# Patient Record
Sex: Female | Born: 1985 | Race: Black or African American | Hispanic: No | Marital: Single | Smoking: Former smoker
Health system: Southern US, Community
[De-identification: ages and names within clinical notes are randomized; demographics above are authoritative.]

## PROBLEM LIST (undated history)

## (undated) ENCOUNTER — Inpatient Hospital Stay (HOSPITAL_COMMUNITY): Payer: Self-pay

## (undated) DIAGNOSIS — D573 Sickle-cell trait: Secondary | ICD-10-CM

## (undated) DIAGNOSIS — A5901 Trichomonal vulvovaginitis: Secondary | ICD-10-CM

## (undated) DIAGNOSIS — Z98891 History of uterine scar from previous surgery: Secondary | ICD-10-CM

## (undated) DIAGNOSIS — Z8619 Personal history of other infectious and parasitic diseases: Secondary | ICD-10-CM

## (undated) DIAGNOSIS — Z973 Presence of spectacles and contact lenses: Secondary | ICD-10-CM

## (undated) DIAGNOSIS — Z789 Other specified health status: Secondary | ICD-10-CM

## (undated) HISTORY — DX: Sickle-cell trait: D57.3

## (undated) HISTORY — DX: Trichomonal vulvovaginitis: A59.01

## (undated) HISTORY — PX: NO PAST SURGERIES: SHX2092

---

## 2012-04-06 LAB — OB RESULTS CONSOLE GC/CHLAMYDIA
Chlamydia: NEGATIVE
Gonorrhea: NEGATIVE

## 2012-04-06 LAB — OB RESULTS CONSOLE HEPATITIS B SURFACE ANTIGEN: HEP B S AG: NEGATIVE

## 2012-06-10 LAB — OB RESULTS CONSOLE RPR: RPR: NONREACTIVE

## 2012-06-10 LAB — OB RESULTS CONSOLE HIV ANTIBODY (ROUTINE TESTING): HIV: NONREACTIVE

## 2013-04-06 ENCOUNTER — Other Ambulatory Visit: Payer: Self-pay

## 2013-04-06 LAB — OB RESULTS CONSOLE ANTIBODY SCREEN: Antibody Screen: NEGATIVE

## 2013-04-06 LAB — OB RESULTS CONSOLE ABO/RH: RH TYPE: POSITIVE

## 2013-04-11 ENCOUNTER — Other Ambulatory Visit (HOSPITAL_COMMUNITY): Payer: Self-pay | Admitting: Obstetrics and Gynecology

## 2013-04-11 DIAGNOSIS — Z0489 Encounter for examination and observation for other specified reasons: Secondary | ICD-10-CM

## 2013-04-11 DIAGNOSIS — IMO0002 Reserved for concepts with insufficient information to code with codable children: Secondary | ICD-10-CM

## 2013-04-11 DIAGNOSIS — R772 Abnormality of alphafetoprotein: Secondary | ICD-10-CM

## 2013-04-11 LAB — OB RESULTS CONSOLE GC/CHLAMYDIA
Chlamydia: NEGATIVE
Gonorrhea: NEGATIVE

## 2013-04-12 ENCOUNTER — Ambulatory Visit (HOSPITAL_COMMUNITY)
Admission: RE | Admit: 2013-04-12 | Discharge: 2013-04-12 | Disposition: A | Discharge status: Home / Self Care | Payer: Medicaid Other | Source: Ambulatory Visit | Attending: Obstetrics and Gynecology | Admitting: Obstetrics and Gynecology

## 2013-04-12 ENCOUNTER — Encounter (HOSPITAL_COMMUNITY): Payer: Self-pay

## 2013-04-12 ENCOUNTER — Other Ambulatory Visit (HOSPITAL_COMMUNITY): Payer: Self-pay | Admitting: Obstetrics and Gynecology

## 2013-04-12 DIAGNOSIS — R772 Abnormality of alphafetoprotein: Secondary | ICD-10-CM

## 2013-04-12 DIAGNOSIS — IMO0002 Reserved for concepts with insufficient information to code with codable children: Secondary | ICD-10-CM

## 2013-04-12 DIAGNOSIS — Z0489 Encounter for examination and observation for other specified reasons: Secondary | ICD-10-CM

## 2013-04-12 DIAGNOSIS — O289 Unspecified abnormal findings on antenatal screening of mother: Secondary | ICD-10-CM

## 2013-04-12 DIAGNOSIS — O358XX0 Maternal care for other (suspected) fetal abnormality and damage, not applicable or unspecified: Secondary | ICD-10-CM | POA: Insufficient documentation

## 2013-04-12 DIAGNOSIS — O352XX0 Maternal care for (suspected) hereditary disease in fetus, not applicable or unspecified: Secondary | ICD-10-CM | POA: Insufficient documentation

## 2013-04-12 DIAGNOSIS — Z363 Encounter for antenatal screening for malformations: Secondary | ICD-10-CM | POA: Insufficient documentation

## 2013-04-12 DIAGNOSIS — Z1389 Encounter for screening for other disorder: Secondary | ICD-10-CM | POA: Insufficient documentation

## 2013-04-12 LAB — ROUTINE CHROMOSOME - KARYOTYPE + FISH

## 2013-04-12 LAB — AP-AFP (ALPHA FETOPROTEIN)

## 2013-04-12 NOTE — ED Notes (Addendum)
Genetic Counseling  DOB: 1985-08-28 Referring Provider: Melina Schools, MD Appointment Date: 04/12/2013 Attending: Dr. Renella Cunas  [name] and her partner, [name], were seen for genetic counseling because of an increased risk for fetal Down syndrome.  They were counseled regarding the [facility] screen result and the associated 1 in 220 risk for fetal Down syndrome.  We reviewed chromosomes, nondisjunction, and the common features and variable prognosis of Down syndrome.  In addition, we reviewed the screen adjusted reduction in risks for trisomy 18 and ONTDs.  We also discussed other explanations for a screen positive result including: a gestational dating error, differences in maternal metabolism, and normal variation. They understand that this screening is not diagnostic for Down syndrome but provides a risk assessment.  We reviewed available screening options including noninvasive prenatal screening (NIPS)/cell free fetal DNA (cffDNA) testing, and detailed ultrasound.  They were counseled that screening tests are used to modify a patient's a priori risk for aneuploidy, typically based on age. This estimate provides a pregnancy specific risk assessment. We reviewed the benefits and limitations of each option. Specifically, we discussed the conditions for which each test screens, the detection rates, and false positive rates of each. They were also counseled regarding diagnostic testing via amniocentesis. We reviewed the approximate 1 in 850-277 risk for complications for amniocentesis, including spontaneous pregnancy loss.  A complete ultrasound was performed today. The ultrasound report will be sent under separate cover. An echogenic intracardiac focus was identified.  We discussed that this increases the risk for Down syndrome to approximately 1 in 131.  They elected to proceed with amniocentesis and FISH analysis.  They expressed that if they were to learn that the baby has Down  syndrome, they may wish to not continue the pregnancy.  They understand the legal limit of 20 weeks and the 24 hour waiting period prior to a termination procedure.  They were counseled that we will do all that we can to get the preliminary FISH results prior to that time, but cannot guarantee they will be completed.   Both family histories were reviewed and found to be contributory.  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  We discussed that this is an autosomal recessive condition.  In order for her child to have SCA, both she and the father of the baby would have to be carriers, and they would have to pass on the abnormal gene.  [name] says he is not a carrier of a hemoglobin variant.  If he does have normal hemoglobin, then we would not anticipate an increased chance for a clinically significant hemoglobinopathy in this baby.  [name] reported that he has a son from another relationship who was born with a heart defect and had surgery in the newborn period.  We discussed that isolated congenital heart defects typically follow a multifactorial pattern of inheritance.  As such, there would be an approximate 2-3% chance for another child to have an isolated heart defect. Fetal echocardiogram would be an option for further evaluation of the fetal heart.  Without further information regarding the provided family history, an accurate genetic risk cannot be calculated. Further genetic counseling is warranted if more information is obtained.  [name] denied exposure to environmental toxins or chemical agents. She denied the use of alcohol, tobacco or street drugs. She denied significant viral illnesses during the course of her pregnancy. Her medical and surgical histories were noncontributory.   I  counseled this couple for approximately 40 minutes regarding the above risks and available options.   Cam Hai, MS,  Certified Genetic Counselor

## 2013-04-13 ENCOUNTER — Other Ambulatory Visit: Payer: Self-pay

## 2013-04-13 ENCOUNTER — Other Ambulatory Visit: Payer: Self-pay | Admitting: Obstetrics and Gynecology

## 2013-04-13 ENCOUNTER — Telehealth (HOSPITAL_COMMUNITY): Payer: Self-pay | Admitting: *Deleted

## 2013-04-13 ENCOUNTER — Telehealth (HOSPITAL_COMMUNITY): Payer: Self-pay | Admitting: Maternal and Fetal Medicine

## 2013-04-13 NOTE — Telephone Encounter (Signed)
Pt called with c/o cramping this morning.  States she did not have any cramping yesterday after her procedure.  Patient is currently at work for a 14 hour shift.  She has not taken anything for cramping.  Instructed patient that she could take tylenol for cramping and to try to stay off her feet to see if the cramping resolves.  Patient states she called her primary OB office and they instructed her to call here.  Explained to patient that if cramping did not resolve and if she still had concerns that she should go to [facility] to be seen.  She should also call her primary doctor to let them know as they will be treating her.  Pt verbalized understanding.  Encouraged patient to call back with any other questions or concerns.

## 2013-04-13 NOTE — Telephone Encounter (Signed)
Called [employer] to discuss the preliminary FISH results from her amniocentesis We reviewed that these are within normal limits.  We again discussed the limitations of FISH and that final results are still pending and will be available in 1-2 weeks.  Patient reported that she has not noticed any concerning symptoms or complications following the procedure.  All questions were answered to her satisfaction, she was encouraged to call with additional questions or concerns.  Cam Hai, MS Certified Genetic Counselor

## 2013-04-29 ENCOUNTER — Telehealth (HOSPITAL_COMMUNITY): Payer: Self-pay | Admitting: Maternal and Fetal Medicine

## 2013-04-29 NOTE — Telephone Encounter (Signed)
Called Ms. Dise to review the final results of her amniocentesis.  There was no answer and I could not leave a message.

## 2013-05-10 ENCOUNTER — Ambulatory Visit (HOSPITAL_COMMUNITY): Payer: Medicaid Other

## 2013-05-12 ENCOUNTER — Inpatient Hospital Stay (HOSPITAL_COMMUNITY)
Admission: AD | Admit: 2013-05-12 | Discharge: 2013-05-12 | Disposition: A | Discharge status: Home / Self Care | Payer: Medicaid Other | Source: Ambulatory Visit | Attending: Obstetrics and Gynecology | Admitting: Obstetrics and Gynecology

## 2013-05-12 DIAGNOSIS — O4700 False labor before 37 completed weeks of gestation, unspecified trimester: Secondary | ICD-10-CM | POA: Insufficient documentation

## 2013-05-12 MED ORDER — BETAMETHASONE SOD PHOS & ACET 6 (3-3) MG/ML IJ SUSP
12.0000 mg | Freq: Once | INTRAMUSCULAR | Status: AC
Start: 2013-05-12 — End: 2013-05-12
  Administered 2013-05-12: 12 mg via INTRAMUSCULAR
  Filled 2013-05-12: qty 2

## 2013-05-13 ENCOUNTER — Inpatient Hospital Stay (HOSPITAL_COMMUNITY)
Admission: AD | Admit: 2013-05-13 | Discharge: 2013-05-13 | Disposition: A | Discharge status: Home / Self Care | Payer: Medicaid Other | Source: Ambulatory Visit | Attending: Obstetrics and Gynecology | Admitting: Obstetrics and Gynecology

## 2013-05-13 DIAGNOSIS — O4700 False labor before 37 completed weeks of gestation, unspecified trimester: Secondary | ICD-10-CM | POA: Insufficient documentation

## 2013-05-13 MED ORDER — BETAMETHASONE SOD PHOS & ACET 6 (3-3) MG/ML IJ SUSP
12.0000 mg | Freq: Once | INTRAMUSCULAR | Status: AC
  Administered 2013-05-13: 12 mg via INTRAMUSCULAR
  Filled 2013-05-13: qty 2

## 2013-06-10 LAB — OB RESULTS CONSOLE RPR: RPR: NONREACTIVE

## 2013-06-10 LAB — OB RESULTS CONSOLE HIV ANTIBODY (ROUTINE TESTING): HIV: NONREACTIVE

## 2013-08-01 LAB — OB RESULTS CONSOLE GBS
GBS: NEGATIVE
GBS: NEGATIVE

## 2013-08-22 ENCOUNTER — Ambulatory Visit (HOSPITAL_COMMUNITY)
Admission: RE | Admit: 2013-08-22 | Discharge: 2013-08-22 | Disposition: A | Discharge status: Home / Self Care | Payer: Medicaid Other | Source: Ambulatory Visit | Attending: Obstetrics and Gynecology | Admitting: Obstetrics and Gynecology

## 2013-08-22 ENCOUNTER — Other Ambulatory Visit (HOSPITAL_COMMUNITY): Payer: Self-pay | Admitting: Obstetrics and Gynecology

## 2013-08-22 DIAGNOSIS — O288 Other abnormal findings on antenatal screening of mother: Secondary | ICD-10-CM

## 2013-08-22 DIAGNOSIS — O289 Unspecified abnormal findings on antenatal screening of mother: Secondary | ICD-10-CM | POA: Diagnosis not present

## 2013-09-06 ENCOUNTER — Encounter (HOSPITAL_COMMUNITY): Payer: Self-pay | Admitting: *Deleted

## 2013-09-06 ENCOUNTER — Telehealth (HOSPITAL_COMMUNITY): Payer: Self-pay | Admitting: *Deleted

## 2013-09-06 NOTE — Telephone Encounter (Signed)
Preadmission screen  

## 2013-09-08 ENCOUNTER — Inpatient Hospital Stay (HOSPITAL_COMMUNITY)
Admission: AD | Admit: 2013-09-08 | Discharge: 2013-09-11 | DRG: 765 | Disposition: A | Discharge status: Home / Self Care | Payer: Medicaid Other | Source: Ambulatory Visit | Attending: Obstetrics and Gynecology | Admitting: Obstetrics and Gynecology

## 2013-09-08 ENCOUNTER — Encounter (HOSPITAL_COMMUNITY)
Admission: AD | Disposition: A | Discharge status: Home / Self Care | Payer: Self-pay | Source: Ambulatory Visit | Attending: Obstetrics and Gynecology

## 2013-09-08 ENCOUNTER — Encounter (HOSPITAL_COMMUNITY): Payer: Self-pay | Admitting: *Deleted

## 2013-09-08 ENCOUNTER — Encounter (HOSPITAL_COMMUNITY): Payer: Medicaid Other | Admitting: Anesthesiology

## 2013-09-08 ENCOUNTER — Inpatient Hospital Stay (HOSPITAL_COMMUNITY): Payer: Medicaid Other | Admitting: Anesthesiology

## 2013-09-08 DIAGNOSIS — O26879 Cervical shortening, unspecified trimester: Secondary | ICD-10-CM | POA: Diagnosis present

## 2013-09-08 DIAGNOSIS — Z800 Family history of malignant neoplasm of digestive organs: Secondary | ICD-10-CM

## 2013-09-08 DIAGNOSIS — O9902 Anemia complicating childbirth: Secondary | ICD-10-CM | POA: Diagnosis present

## 2013-09-08 DIAGNOSIS — O479 False labor, unspecified: Secondary | ICD-10-CM | POA: Diagnosis present

## 2013-09-08 DIAGNOSIS — O358XX0 Maternal care for other (suspected) fetal abnormality and damage, not applicable or unspecified: Secondary | ICD-10-CM | POA: Diagnosis present

## 2013-09-08 DIAGNOSIS — Z87891 Personal history of nicotine dependence: Secondary | ICD-10-CM | POA: Diagnosis not present

## 2013-09-08 DIAGNOSIS — D573 Sickle-cell trait: Secondary | ICD-10-CM | POA: Diagnosis present

## 2013-09-08 DIAGNOSIS — Z98891 History of uterine scar from previous surgery: Secondary | ICD-10-CM

## 2013-09-08 DIAGNOSIS — O480 Post-term pregnancy: Secondary | ICD-10-CM | POA: Diagnosis present

## 2013-09-08 DIAGNOSIS — O4590 Premature separation of placenta, unspecified, unspecified trimester: Secondary | ICD-10-CM | POA: Diagnosis present

## 2013-09-08 DIAGNOSIS — Z3400 Encounter for supervision of normal first pregnancy, unspecified trimester: Secondary | ICD-10-CM

## 2013-09-08 HISTORY — DX: History of uterine scar from previous surgery: Z98.891

## 2013-09-08 HISTORY — DX: Other specified health status: Z78.9

## 2013-09-08 LAB — CBC
HCT: 34.1 % — ABNORMAL LOW (ref 36.0–46.0)
Hemoglobin: 11.6 g/dL — ABNORMAL LOW (ref 12.0–15.0)
MCH: 28.8 pg (ref 26.0–34.0)
MCHC: 34 g/dL (ref 30.0–36.0)
MCV: 84.6 fL (ref 78.0–100.0)
Platelets: 242 10*3/uL (ref 150–400)
RBC: 4.03 MIL/uL (ref 3.87–5.11)
RDW: 15.2 % (ref 11.5–15.5)
WBC: 8.4 10*3/uL (ref 4.0–10.5)

## 2013-09-08 LAB — TYPE AND SCREEN
ABO/RH(D): O POS
Antibody Screen: NEGATIVE

## 2013-09-08 LAB — ABO/RH: ABO/RH(D): O POS

## 2013-09-08 SURGERY — Surgical Case
Anesthesia: Epidural

## 2013-09-08 MED ORDER — PROMETHAZINE HCL 25 MG/ML IJ SOLN: 6.2500 mg | INTRAMUSCULAR | Status: DC | PRN

## 2013-09-08 MED ORDER — LIDOCAINE HCL (PF) 1 % IJ SOLN
INTRAMUSCULAR | Status: DC | PRN
  Administered 2013-09-08 (×2): 5 mL

## 2013-09-08 MED ORDER — FENTANYL 2.5 MCG/ML BUPIVACAINE 1/10 % EPIDURAL INFUSION (WH - ANES)
14.0000 mL/h | INTRAMUSCULAR | Status: DC | PRN
  Administered 2013-09-08: 14 mL/h via EPIDURAL

## 2013-09-08 MED ORDER — OXYCODONE-ACETAMINOPHEN 5-325 MG PO TABS
1.0000 | ORAL_TABLET | ORAL | Status: DC | PRN
  Administered 2013-09-10: 2 via ORAL
  Administered 2013-09-10 (×2): 1 via ORAL
  Administered 2013-09-10 – 2013-09-11 (×3): 2 via ORAL
  Filled 2013-09-08: qty 2
  Filled 2013-09-08: qty 1
  Filled 2013-09-08 (×3): qty 2
  Filled 2013-09-08: qty 1

## 2013-09-08 MED ORDER — SODIUM CHLORIDE 0.9 % IJ SOLN: 3.0000 mL | INTRAMUSCULAR | Status: DC | PRN

## 2013-09-08 MED ORDER — MIDAZOLAM HCL 2 MG/2ML IJ SOLN: 0.5000 mg | Freq: Once | INTRAMUSCULAR | Status: DC | PRN

## 2013-09-08 MED ORDER — MEPERIDINE HCL 25 MG/ML IJ SOLN: 6.2500 mg | INTRAMUSCULAR | Status: DC | PRN

## 2013-09-08 MED ORDER — BUTORPHANOL TARTRATE 1 MG/ML IJ SOLN
1.0000 mg | INTRAMUSCULAR | Status: DC | PRN
  Filled 2013-09-08: qty 1

## 2013-09-08 MED ORDER — OXYTOCIN 40 UNITS IN LACTATED RINGERS INFUSION - SIMPLE MED: 1.0000 m[IU]/min | INTRAVENOUS | Status: DC

## 2013-09-08 MED ORDER — IBUPROFEN 800 MG PO TABS
800.0000 mg | ORAL_TABLET | Freq: Three times a day (TID) | ORAL | Status: DC
  Administered 2013-09-08 – 2013-09-11 (×8): 800 mg via ORAL
  Filled 2013-09-08 (×8): qty 1

## 2013-09-08 MED ORDER — FENTANYL CITRATE 0.05 MG/ML IJ SOLN
INTRAMUSCULAR | Status: AC
  Administered 2013-09-08: 50 ug via INTRAVENOUS
  Filled 2013-09-08: qty 2

## 2013-09-08 MED ORDER — MEPERIDINE HCL 25 MG/ML IJ SOLN
6.2500 mg | INTRAMUSCULAR | Status: DC | PRN
  Administered 2013-09-08: 12.5 mg via INTRAVENOUS

## 2013-09-08 MED ORDER — ONDANSETRON HCL 4 MG/2ML IJ SOLN: 4.0000 mg | Freq: Three times a day (TID) | INTRAMUSCULAR | Status: DC | PRN

## 2013-09-08 MED ORDER — ACETAMINOPHEN 325 MG PO TABS: 650.0000 mg | ORAL_TABLET | ORAL | Status: DC | PRN

## 2013-09-08 MED ORDER — MORPHINE SULFATE (PF) 0.5 MG/ML IJ SOLN
INTRAMUSCULAR | Status: DC | PRN
  Administered 2013-09-08: 1 mg via INTRAVENOUS

## 2013-09-08 MED ORDER — PHENYLEPHRINE 40 MCG/ML (10ML) SYRINGE FOR IV PUSH (FOR BLOOD PRESSURE SUPPORT): 80.0000 ug | PREFILLED_SYRINGE | INTRAVENOUS | Status: DC | PRN

## 2013-09-08 MED ORDER — ONDANSETRON HCL 4 MG/2ML IJ SOLN: 4.0000 mg | Freq: Four times a day (QID) | INTRAMUSCULAR | Status: DC | PRN

## 2013-09-08 MED ORDER — IBUPROFEN 600 MG PO TABS: 600.0000 mg | ORAL_TABLET | Freq: Four times a day (QID) | ORAL | Status: DC | PRN

## 2013-09-08 MED ORDER — LANOLIN HYDROUS EX OINT: 1.0000 "application " | TOPICAL_OINTMENT | CUTANEOUS | Status: DC | PRN

## 2013-09-08 MED ORDER — ZOLPIDEM TARTRATE 5 MG PO TABS: 5.0000 mg | ORAL_TABLET | Freq: Every evening | ORAL | Status: DC | PRN

## 2013-09-08 MED ORDER — PHENYLEPHRINE 40 MCG/ML (10ML) SYRINGE FOR IV PUSH (FOR BLOOD PRESSURE SUPPORT)
PREFILLED_SYRINGE | INTRAVENOUS | Status: AC
  Filled 2013-09-08: qty 10

## 2013-09-08 MED ORDER — DIPHENHYDRAMINE HCL 50 MG/ML IJ SOLN: 12.5000 mg | INTRAMUSCULAR | Status: DC | PRN

## 2013-09-08 MED ORDER — DIBUCAINE 1 % RE OINT: 1.0000 "application " | TOPICAL_OINTMENT | RECTAL | Status: DC | PRN

## 2013-09-08 MED ORDER — MEPERIDINE HCL 25 MG/ML IJ SOLN
INTRAMUSCULAR | Status: AC
  Filled 2013-09-08: qty 1

## 2013-09-08 MED ORDER — CEFAZOLIN SODIUM-DEXTROSE 2-3 GM-% IV SOLR
INTRAVENOUS | Status: DC | PRN
  Administered 2013-09-08: 2 g via INTRAVENOUS

## 2013-09-08 MED ORDER — CITRIC ACID-SODIUM CITRATE 334-500 MG/5ML PO SOLN
30.0000 mL | ORAL | Status: DC | PRN
  Administered 2013-09-08: 30 mL via ORAL
  Filled 2013-09-08: qty 15

## 2013-09-08 MED ORDER — SCOPOLAMINE 1 MG/3DAYS TD PT72
1.0000 | MEDICATED_PATCH | Freq: Once | TRANSDERMAL | Status: AC
  Administered 2013-09-08: 1.5 mg via TRANSDERMAL

## 2013-09-08 MED ORDER — DIPHENHYDRAMINE HCL 25 MG PO CAPS
25.0000 mg | ORAL_CAPSULE | Freq: Four times a day (QID) | ORAL | Status: DC | PRN
  Administered 2013-09-08: 25 mg via ORAL
  Filled 2013-09-08: qty 1

## 2013-09-08 MED ORDER — NALOXONE HCL 1 MG/ML IJ SOLN: 1.0000 ug/kg/h | INTRAVENOUS | Status: DC | PRN

## 2013-09-08 MED ORDER — MORPHINE SULFATE 0.5 MG/ML IJ SOLN
INTRAMUSCULAR | Status: AC
  Filled 2013-09-08: qty 10

## 2013-09-08 MED ORDER — NALOXONE HCL 0.4 MG/ML IJ SOLN
0.4000 mg | INTRAMUSCULAR | Status: DC | PRN
Start: 2013-09-08 — End: 2013-09-11

## 2013-09-08 MED ORDER — LIDOCAINE-EPINEPHRINE (PF) 2 %-1:200000 IJ SOLN
INTRAMUSCULAR | Status: AC
  Filled 2013-09-08: qty 20

## 2013-09-08 MED ORDER — LIDOCAINE HCL (PF) 1 % IJ SOLN: 30.0000 mL | INTRAMUSCULAR | Status: DC | PRN

## 2013-09-08 MED ORDER — LACTATED RINGERS IV SOLN
INTRAVENOUS | Status: DC | PRN
  Administered 2013-09-08: 15:00:00 via INTRAVENOUS

## 2013-09-08 MED ORDER — LACTATED RINGERS IV SOLN: 500.0000 mL | INTRAVENOUS | Status: DC | PRN

## 2013-09-08 MED ORDER — KETOROLAC TROMETHAMINE 30 MG/ML IJ SOLN
INTRAMUSCULAR | Status: AC
  Administered 2013-09-08: 30 mg via INTRAMUSCULAR
  Filled 2013-09-08: qty 1

## 2013-09-08 MED ORDER — FENTANYL 2.5 MCG/ML BUPIVACAINE 1/10 % EPIDURAL INFUSION (WH - ANES)
INTRAMUSCULAR | Status: AC
  Filled 2013-09-08: qty 125

## 2013-09-08 MED ORDER — EPHEDRINE 5 MG/ML INJ: 10.0000 mg | INTRAVENOUS | Status: DC | PRN

## 2013-09-08 MED ORDER — CEFAZOLIN SODIUM-DEXTROSE 2-3 GM-% IV SOLR
INTRAVENOUS | Status: AC
  Filled 2013-09-08: qty 50

## 2013-09-08 MED ORDER — SIMETHICONE 80 MG PO CHEW
80.0000 mg | CHEWABLE_TABLET | Freq: Three times a day (TID) | ORAL | Status: DC
  Administered 2013-09-09 – 2013-09-11 (×7): 80 mg via ORAL
  Filled 2013-09-08 (×6): qty 1

## 2013-09-08 MED ORDER — OXYTOCIN 40 UNITS IN LACTATED RINGERS INFUSION - SIMPLE MED: 62.5000 mL/h | INTRAVENOUS | Status: AC

## 2013-09-08 MED ORDER — OXYCODONE-ACETAMINOPHEN 5-325 MG PO TABS: 1.0000 | ORAL_TABLET | ORAL | Status: DC | PRN

## 2013-09-08 MED ORDER — ONDANSETRON HCL 4 MG PO TABS: 4.0000 mg | ORAL_TABLET | ORAL | Status: DC | PRN

## 2013-09-08 MED ORDER — NALBUPHINE HCL 10 MG/ML IJ SOLN: 5.0000 mg | INTRAMUSCULAR | Status: DC | PRN

## 2013-09-08 MED ORDER — SIMETHICONE 80 MG PO CHEW
80.0000 mg | CHEWABLE_TABLET | ORAL | Status: DC | PRN
  Filled 2013-09-08: qty 1

## 2013-09-08 MED ORDER — FENTANYL CITRATE 0.05 MG/ML IJ SOLN
INTRAMUSCULAR | Status: AC
  Filled 2013-09-08: qty 2

## 2013-09-08 MED ORDER — SCOPOLAMINE 1 MG/3DAYS TD PT72
MEDICATED_PATCH | TRANSDERMAL | Status: AC
  Administered 2013-09-08: 1.5 mg via TRANSDERMAL
  Filled 2013-09-08: qty 1

## 2013-09-08 MED ORDER — KETOROLAC TROMETHAMINE 30 MG/ML IJ SOLN: 30.0000 mg | Freq: Four times a day (QID) | INTRAMUSCULAR | Status: AC | PRN

## 2013-09-08 MED ORDER — ONDANSETRON HCL 4 MG/2ML IJ SOLN: 4.0000 mg | INTRAMUSCULAR | Status: DC | PRN

## 2013-09-08 MED ORDER — SENNOSIDES-DOCUSATE SODIUM 8.6-50 MG PO TABS
2.0000 | ORAL_TABLET | ORAL | Status: DC
Start: 2013-09-09 — End: 2013-09-11
  Administered 2013-09-09 – 2013-09-11 (×3): 2 via ORAL
  Filled 2013-09-08 (×3): qty 2

## 2013-09-08 MED ORDER — OXYTOCIN 10 UNIT/ML IJ SOLN
INTRAMUSCULAR | Status: AC
  Filled 2013-09-08: qty 4

## 2013-09-08 MED ORDER — METOCLOPRAMIDE HCL 5 MG/ML IJ SOLN: 10.0000 mg | Freq: Three times a day (TID) | INTRAMUSCULAR | Status: DC | PRN

## 2013-09-08 MED ORDER — KETOROLAC TROMETHAMINE 30 MG/ML IJ SOLN
30.0000 mg | Freq: Four times a day (QID) | INTRAMUSCULAR | Status: AC | PRN
  Administered 2013-09-08: 30 mg via INTRAMUSCULAR

## 2013-09-08 MED ORDER — DIPHENHYDRAMINE HCL 50 MG/ML IJ SOLN: 25.0000 mg | INTRAMUSCULAR | Status: DC | PRN

## 2013-09-08 MED ORDER — LACTATED RINGERS IV SOLN: 500.0000 mL | Freq: Once | INTRAVENOUS | Status: DC

## 2013-09-08 MED ORDER — TERBUTALINE SULFATE 1 MG/ML IJ SOLN: 0.2500 mg | Freq: Once | INTRAMUSCULAR | Status: DC | PRN

## 2013-09-08 MED ORDER — ONDANSETRON HCL 4 MG/2ML IJ SOLN
INTRAMUSCULAR | Status: AC
  Filled 2013-09-08: qty 2

## 2013-09-08 MED ORDER — ONDANSETRON HCL 4 MG/2ML IJ SOLN
INTRAMUSCULAR | Status: DC | PRN
  Administered 2013-09-08: 4 mg via INTRAVENOUS

## 2013-09-08 MED ORDER — SIMETHICONE 80 MG PO CHEW
80.0000 mg | CHEWABLE_TABLET | ORAL | Status: DC
  Administered 2013-09-09 – 2013-09-11 (×3): 80 mg via ORAL
  Filled 2013-09-08 (×3): qty 1

## 2013-09-08 MED ORDER — LACTATED RINGERS IV SOLN
INTRAVENOUS | Status: DC
  Administered 2013-09-08 (×2): via INTRAVENOUS

## 2013-09-08 MED ORDER — FENTANYL CITRATE 0.05 MG/ML IJ SOLN
25.0000 ug | INTRAMUSCULAR | Status: DC | PRN
  Administered 2013-09-08: 50 ug via INTRAVENOUS

## 2013-09-08 MED ORDER — EPHEDRINE 5 MG/ML INJ
INTRAVENOUS | Status: AC
  Filled 2013-09-08: qty 4

## 2013-09-08 MED ORDER — LACTATED RINGERS IV SOLN
INTRAVENOUS | Status: DC
  Administered 2013-09-08: 20:00:00 via INTRAVENOUS

## 2013-09-08 MED ORDER — WITCH HAZEL-GLYCERIN EX PADS: 1.0000 "application " | MEDICATED_PAD | CUTANEOUS | Status: DC | PRN

## 2013-09-08 MED ORDER — MENTHOL 3 MG MT LOZG: 1.0000 | LOZENGE | OROMUCOSAL | Status: DC | PRN

## 2013-09-08 MED ORDER — OXYTOCIN 10 UNIT/ML IJ SOLN
40.0000 [IU] | INTRAVENOUS | Status: DC | PRN
  Administered 2013-09-08: 40 [IU] via INTRAVENOUS

## 2013-09-08 MED ORDER — DIPHENHYDRAMINE HCL 50 MG/ML IJ SOLN
12.5000 mg | INTRAMUSCULAR | Status: DC | PRN
Start: 2013-09-08 — End: 2013-09-11

## 2013-09-08 MED ORDER — OXYTOCIN 40 UNITS IN LACTATED RINGERS INFUSION - SIMPLE MED
62.5000 mL/h | INTRAVENOUS | Status: DC
Start: 2013-09-08 — End: 2013-09-08

## 2013-09-08 MED ORDER — DIPHENHYDRAMINE HCL 25 MG PO CAPS: 25.0000 mg | ORAL_CAPSULE | ORAL | Status: DC | PRN

## 2013-09-08 MED ORDER — NALBUPHINE HCL 10 MG/ML IJ SOLN
5.0000 mg | INTRAMUSCULAR | Status: DC | PRN
  Administered 2013-09-08: 10 mg via SUBCUTANEOUS
  Filled 2013-09-08: qty 1

## 2013-09-08 MED ORDER — ACETAMINOPHEN 500 MG PO TABS
1000.0000 mg | ORAL_TABLET | Freq: Four times a day (QID) | ORAL | Status: AC
  Administered 2013-09-09 (×3): 1000 mg via ORAL
  Filled 2013-09-08 (×3): qty 2

## 2013-09-08 MED ORDER — SODIUM BICARBONATE 8.4 % IV SOLN
INTRAVENOUS | Status: DC | PRN
  Administered 2013-09-08: 10 mL via EPIDURAL

## 2013-09-08 MED ORDER — MORPHINE SULFATE (PF) 0.5 MG/ML IJ SOLN
INTRAMUSCULAR | Status: DC | PRN
  Administered 2013-09-08: 4 mg via EPIDURAL

## 2013-09-08 MED ORDER — 0.9 % SODIUM CHLORIDE (POUR BTL) OPTIME
TOPICAL | Status: DC | PRN
  Administered 2013-09-08: 1000 mL

## 2013-09-08 MED ORDER — FENTANYL CITRATE 0.05 MG/ML IJ SOLN
INTRAMUSCULAR | Status: DC | PRN
  Administered 2013-09-08 (×2): 25 ug via INTRAVENOUS
  Administered 2013-09-08: 50 ug via INTRAVENOUS

## 2013-09-08 MED ORDER — LACTATED RINGERS IV SOLN
INTRAVENOUS | Status: DC | PRN
  Administered 2013-09-08 (×2): via INTRAVENOUS

## 2013-09-08 MED ORDER — SODIUM BICARBONATE 8.4 % IV SOLN
INTRAVENOUS | Status: AC
  Filled 2013-09-08: qty 50

## 2013-09-08 MED ORDER — OXYTOCIN BOLUS FROM INFUSION: 500.0000 mL | INTRAVENOUS | Status: DC

## 2013-09-08 MED ORDER — PRENATAL MULTIVITAMIN CH
1.0000 | ORAL_TABLET | Freq: Every day | ORAL | Status: DC
  Administered 2013-09-09 – 2013-09-11 (×3): 1 via ORAL
  Filled 2013-09-08 (×3): qty 1

## 2013-09-08 SURGICAL SUPPLY — 38 items
BENZOIN TINCTURE PRP APPL 2/3 (GAUZE/BANDAGES/DRESSINGS) ×3 IMPLANT
BLADE SURG 10 STRL SS (BLADE) ×6 IMPLANT
CLAMP CORD UMBIL (MISCELLANEOUS) IMPLANT
CLOSURE WOUND 1/2 X4 (GAUZE/BANDAGES/DRESSINGS) ×1
CLOTH BEACON ORANGE TIMEOUT ST (SAFETY) ×3 IMPLANT
CONTAINER PREFILL 10% NBF 15ML (MISCELLANEOUS) IMPLANT
DRAPE LG THREE QUARTER DISP (DRAPES) IMPLANT
DRSG OPSITE POSTOP 4X10 (GAUZE/BANDAGES/DRESSINGS) ×3 IMPLANT
DURAPREP 26ML APPLICATOR (WOUND CARE) ×3 IMPLANT
ELECT REM PT RETURN 9FT ADLT (ELECTROSURGICAL) ×3
ELECTRODE REM PT RTRN 9FT ADLT (ELECTROSURGICAL) ×1 IMPLANT
EXTRACTOR VACUUM M CUP 4 TUBE (SUCTIONS) IMPLANT
EXTRACTOR VACUUM M CUP 4' TUBE (SUCTIONS)
GLOVE BIO SURGEON STRL SZ 6.5 (GLOVE) ×2 IMPLANT
GLOVE BIO SURGEONS STRL SZ 6.5 (GLOVE) ×1
GOWN STRL REUS W/TWL LRG LVL3 (GOWN DISPOSABLE) ×6 IMPLANT
KIT ABG SYR 3ML LUER SLIP (SYRINGE) IMPLANT
NEEDLE HYPO 25X5/8 SAFETYGLIDE (NEEDLE) IMPLANT
NS IRRIG 1000ML POUR BTL (IV SOLUTION) ×3 IMPLANT
PACK C SECTION WH (CUSTOM PROCEDURE TRAY) ×3 IMPLANT
PAD OB MATERNITY 4.3X12.25 (PERSONAL CARE ITEMS) ×3 IMPLANT
RTRCTR C-SECT PINK 25CM LRG (MISCELLANEOUS) ×3 IMPLANT
STAPLER VISISTAT 35W (STAPLE) IMPLANT
STRIP CLOSURE SKIN 1/2X4 (GAUZE/BANDAGES/DRESSINGS) ×2 IMPLANT
SUT MNCRL 0 VIOLET CTX 36 (SUTURE) ×3 IMPLANT
SUT MNCRL AB 0 CT1 27 (SUTURE) ×3 IMPLANT
SUT MONOCRYL 0 CTX 36 (SUTURE) ×6
SUT PLAIN 1 NONE 54 (SUTURE) IMPLANT
SUT PLAIN 2 0 XLH (SUTURE) ×3 IMPLANT
SUT VIC AB 0 CT1 27 (SUTURE) ×4
SUT VIC AB 0 CT1 27XBRD ANBCTR (SUTURE) ×2 IMPLANT
SUT VIC AB 2-0 CT1 27 (SUTURE) ×2
SUT VIC AB 2-0 CT1 TAPERPNT 27 (SUTURE) ×1 IMPLANT
SUT VIC AB 4-0 KS 27 (SUTURE) ×3 IMPLANT
SYR BULB IRRIGATION 50ML (SYRINGE) ×3 IMPLANT
TOWEL OR 17X24 6PK STRL BLUE (TOWEL DISPOSABLE) ×3 IMPLANT
TRAY FOLEY CATH 14FR (SET/KITS/TRAYS/PACK) ×3 IMPLANT
WATER STERILE IRR 1000ML POUR (IV SOLUTION) ×3 IMPLANT

## 2013-09-08 NOTE — H&P (Signed)
[name] is a 28 y.o. female G2P0010 at 58 wk in early labor.  +FM, Q if decreased, no LOF, no VB, ctx increasing in intensity and frequency.  Pregnancy complicated by increased risk of Down's syndrome with nl 46AA amnio.  Also shortened cervix, got BMZ at 24 weeks.  S<D followed with [place], good growth - 34% Also 2VC and EIF.  Also calcifications in fetal liver.    Maternal Medical History:  Reason for admission: Contractions.   Contractions: Onset was 3-5 hours ago.   Frequency: regular.   Perceived severity is strong.    Fetal activity: Perceived fetal activity is normal.    Prenatal Complications - Diabetes: none.    OB History   Grav Para Term Preterm Abortions TAB SAB Ect Mult Living   2 0   1 1        G1 TAB G2 present  No abn pap, h/o trich  Past Medical History  Diagnosis Date  . Trichomoniasis of vagina     treated  . Sickle cell trait   . Medical history non-contributory    Past Surgical History  Procedure Laterality Date  . No past surgeries     TAB  Family History: pancreatic CA Social History:  reports that she quit smoking about 8 months ago. She has never used smokeless tobacco. She reports that she does not drink alcohol or use illicit drugs.  Meds PNV, vaginal progesterone All NKDA   Prenatal Transfer Tool  Maternal Diabetes: No Genetic Screening: Abnormal:  Results: Elevated risk of Trisomy 21 Maternal Ultrasounds/Referrals: Abnormal:  Findings:   Isolated EIF (echogenic intracardiac focus), Other: Fetal Ultrasounds or other Referrals:  Referred to Materal Fetal Medicine , Other: Amnio 46XX Maternal Substance Abuse:  Yes:  Type: Smoker, Other: ?Alcohol Significant Maternal Medications:  None Significant Maternal Lab Results:  Lab values include: Group B Strep negative Other Comments:  short cvx - BMZ 3/26-27; amnio 46XX, short cervix - vag prog, sickle trait, calcifications in liver on fetal US  Review of Systems  Constitutional: Negative.    HENT: Negative.   Eyes: Negative.   Respiratory: Negative.   Cardiovascular: Negative.   Gastrointestinal: Negative.   Genitourinary: Negative.   Musculoskeletal: Negative.   Skin: Negative.   Neurological: Negative.   Psychiatric/Behavioral: Negative.       Height 5\' 4"  (1.626 m), weight 69.4 kg (153 lb), last menstrual period 11/27/2012. Maternal Exam:  Uterine Assessment: Contraction strength is firm.  Contraction frequency is regular.   Abdomen: Fundal height is 36cm.   Estimated fetal weight is 5-6#.   Fetal presentation: vertex  Introitus: Normal vulva. Normal vagina.  Pelvis: adequate for delivery.   Cervix: Cervix evaluated by digital exam.     Physical Exam  Constitutional: She is oriented to person, place, and time. She appears well-developed and well-nourished.  HENT:  Head: Normocephalic and atraumatic.  Cardiovascular: Normal rate and regular rhythm.   Respiratory: Effort normal and breath sounds normal. No respiratory distress. She has no wheezes.  GI: Soft. Bowel sounds are normal. She exhibits no distension. There is no tenderness.  Musculoskeletal: Normal range of motion.  Neurological: She is alert and oriented to person, place, and time.  Skin: Skin is warm and dry.  Psychiatric: She has a normal mood and affect. Her behavior is normal.    Prenatal labs: ABO, Rh: O/Positive/-- (02/18 0000) Antibody: Negative (02/18 0000) Rubella:  immune RPR: Nonreactive (04/24 0000)  HBsAg:   neg HIV: Non-reactive (04/24 0000)  GBS: Negative, Negative (06/15 0000)   Tdap 06/09/13  Hgb 12.1/ Ur Cx -Ecoli/ GC neg/ Chl neg/ VI/ Pap WNL/ glucola 76, sickle trait  [place] ant plac, fibroid [place] EIF, 2VC, nl anat, ant plac, female, female, calcification in liver Assessment/Plan: 27yo G1P0 at [address] neg Epidural prn Expect SVD   Bovard-Stuckert, Kippy Melena 09/08/2013, 11:48 AM

## 2013-09-08 NOTE — Progress Notes (Signed)
09/08/13 1600  Clinical Encounter Type  Visited With Health care provider;Patient not available   Responded to overhead page of OB to OR.  Consulted with staff in [place] and baby's RN in NICU, but have not met pt or family.  [place] plans to follow up tomorrow, but please page as needs arise.  Thank you.  [place], [place]

## 2013-09-08 NOTE — Anesthesia Preprocedure Evaluation (Signed)
Anesthesia Evaluation  Patient identified by MRN, date of birth, ID band Patient awake    Reviewed: Allergy & Precautions, H&P , Patient's Chart, lab work & pertinent test results  Airway Mallampati: II TM Distance: >3 FB Neck ROM: full    Dental   Pulmonary former smoker,  breath sounds clear to auscultation        Cardiovascular Rhythm:regular Rate:Normal     Neuro/Psych    GI/Hepatic   Endo/Other    Renal/GU      Musculoskeletal   Abdominal   Peds  Hematology   Anesthesia Other Findings   Reproductive/Obstetrics (+) Pregnancy                           Anesthesia Physical Anesthesia Plan  ASA: II  Anesthesia Plan: Epidural   Post-op Pain Management:    Induction:   Airway Management Planned:   Additional Equipment:   Intra-op Plan:   Post-operative Plan:   Informed Consent: I have reviewed the patients History and Physical, chart, labs and discussed the procedure including the risks, benefits and alternatives for the proposed anesthesia with the patient or authorized representative who has indicated his/her understanding and acceptance.     Plan Discussed with:   Anesthesia Plan Comments:         Anesthesia Quick Evaluation

## 2013-09-08 NOTE — Brief Op Note (Signed)
09/08/2013  3:40 PM  PATIENT:  [name]  28 y.o. female  PRE-OPERATIVE DIAGNOSIS:  IUP @ 41 wk, likely abruption, NRFHT  POST-OPERATIVE DIAGNOSIS: same, delivered  PROCEDURE:  Procedure(s): CESAREAN SECTION (N/A)  SURGEON:  Surgeon(s) and Role:    * Jabron Weese Bovard-Stuckert, MD - Primary  ANESTHESIA:   epidural  EBL:  Total I/O In: 2100 [I.V.:2100] Out: 1600 [Urine:600; Blood:1000]  BLOOD ADMINISTERED:none  DRAINS: Urinary Catheter (Foley)   SPECIMEN:  Source of Specimen:  Placenta  DISPOSITION OF SPECIMEN:  PATHOLOGY  COUNTS:  YES  TOURNIQUET:  * No tourniquets in log *  DICTATION: .Other Dictation: Dictation Number [id number]  PLAN OF CARE: Admit to inpatient   PATIENT DISPOSITION:  PACU - hemodynamically stable.   Delay start of Pharmacological VTE agent (>24hrs) due to surgical blood loss or risk of bleeding: not applicable

## 2013-09-08 NOTE — Progress Notes (Signed)
Patient ID: [name], female   DOB: 17-Aug-1985, 28 y.o.   MRN: [id number]  Writing H&P and pt had long decel to 50's.  FSE placed, AROM performed - bloody, fluid.  Recovery to 150's.  D/W pt and FOB possible need for LTCS including, r/b/a bleeding, infection, damage to surrounding organs, delayed healing of incision, injury to infant - not limited to.

## 2013-09-08 NOTE — Anesthesia Postprocedure Evaluation (Signed)
  Anesthesia Post Note  Patient: [name]  Procedure(s) Performed: Procedure(s) (LRB): CESAREAN SECTION (N/A)  Anesthesia type: Epidural  Patient location: PACU  Post pain: Pain level controlled  Post assessment: Post-op Vital signs reviewed  Last Vitals:  Filed Vitals:   09/08/13 1615  BP: 124/80  Pulse: 79  Temp:   Resp: 18    Post vital signs: Reviewed  Level of consciousness: awake  Complications: No apparent anesthesia complications

## 2013-09-08 NOTE — Transfer of Care (Signed)
Immediate Anesthesia Transfer of Care Note  Patient: [name]  Procedure(s) Performed: Procedure(s): CESAREAN SECTION (N/A)  Patient Location: PACU  Anesthesia Type:Epidural  Level of Consciousness: awake and sedated  Airway & Oxygen Therapy: Patient Spontanous Breathing  Post-op Assessment: Report given to PACU RN  Post vital signs: Reviewed and stable  Complications: No apparent anesthesia complications

## 2013-09-08 NOTE — Anesthesia Procedure Notes (Signed)
Epidural Patient location during procedure: OB Start time: 09/08/2013 1:23 PM  Staffing Anesthesiologist: Rudean Curt Performed by: anesthesiologist   Preanesthetic Checklist Completed: patient identified, site marked, surgical consent, pre-op evaluation, timeout performed, IV checked, risks and benefits discussed and monitors and equipment checked  Epidural Patient position: sitting Prep: site prepped and draped and DuraPrep Patient monitoring: continuous pulse ox and blood pressure Approach: midline Location: L3-L4 Injection technique: LOR air  Needle:  Needle type: Tuohy  Needle gauge: 17 G Needle length: 9 cm and 9 Needle insertion depth: 5 cm cm Catheter type: closed end flexible Catheter size: 19 Gauge Catheter at skin depth: 10 cm Test dose: negative  Assessment Events: blood not aspirated, injection not painful, no injection resistance, negative IV test and no paresthesia  Additional Notes Patient identified.  Risk benefits discussed including failed block, incomplete pain control, headache, nerve damage, paralysis, blood pressure changes, nausea, vomiting, reactions to medication both toxic or allergic, and postpartum back pain.  Patient expressed understanding and wished to proceed.  All questions were answered.  Sterile technique used throughout procedure and epidural site dressed with sterile barrier dressing. No paresthesia or other complications noted.The patient did not experience any signs of intravascular injection such as tinnitus or metallic taste in mouth nor signs of intrathecal spread such as rapid motor block. Please see nursing notes for vital signs.

## 2013-09-09 ENCOUNTER — Encounter (HOSPITAL_COMMUNITY): Payer: Self-pay | Admitting: Obstetrics and Gynecology

## 2013-09-09 ENCOUNTER — Inpatient Hospital Stay (HOSPITAL_COMMUNITY): Admission: RE | Admit: 2013-09-09 | Payer: Medicaid Other | Source: Ambulatory Visit

## 2013-09-09 LAB — RPR

## 2013-09-09 LAB — CBC
HCT: 27.6 % — ABNORMAL LOW (ref 36.0–46.0)
Hemoglobin: 9.3 g/dL — ABNORMAL LOW (ref 12.0–15.0)
MCH: 28.3 pg (ref 26.0–34.0)
MCHC: 33.7 g/dL (ref 30.0–36.0)
MCV: 83.9 fL (ref 78.0–100.0)
Platelets: 201 10*3/uL (ref 150–400)
RBC: 3.29 MIL/uL — ABNORMAL LOW (ref 3.87–5.11)
RDW: 15.3 % (ref 11.5–15.5)
WBC: 12.8 10*3/uL — ABNORMAL HIGH (ref 4.0–10.5)

## 2013-09-09 NOTE — Op Note (Signed)
[name], [name]                ACCOUNT NO.:  [id number]  MEDICAL RECORD NO.:  [id number]  LOCATION:  9305                          FACILITY:  [facility]  PHYSICIAN:  Thornell Sartorius, MD        DATE OF BIRTH:  06/08/1985  DATE OF PROCEDURE:  09/08/2013 DATE OF DISCHARGE:                              OPERATIVE REPORT   PREOPERATIVE DIAGNOSIS:  Intrauterine pregnancy at 41 weeks, likely abruption, nonreassuring fetal heart tracing.  POSTOPERATIVE DIAGNOSIS:  Intrauterine pregnancy at 41 weeks, likely abruption, nonreassuring fetal heart tracing., delivered.  PROCEDURE:  Primary low transverse cesarean section.  SURGEON:  Thornell Sartorius, MD.  ANESTHESIA:  Epidural.  IV FLUIDS:  2100 mL.  EBL:  1000 mL.  URINE OUTPUT:  600 mL.  COMPLICATIONS:  None.  PATHOLOGY:  Placenta to pathology.  PROCEDURE IN DETAIL:  After informed consent was reviewed with the patient including risks, benefits, and alternatives of the surgical procedure, she was rushed to the OR in a stat fashion given that prior she was initially admitted from the office having severe pain and being 2 cm dilated, there was some question of abruption in the labor suite that her uterus was exquisitely tender.  The baby had a decell, however, recovered and an epidural was placed, then had another decell and was unable to recover the baby, was having good variability during the Decell, but Decell was to the 41s.  She was transported as previously mentioned in stat fashion to the OR, placed on the table in supine position with a leftward tilt.  Prepped and draped in a rapid fashion. A Pfannenstiel skin incision was then made at the level of 2 fingerbreadths above the pubic symphysis and carried through the underlying layer of fascia.  Sharply the fascia was incised in the midline.  Incision was extended laterally with blunt dissection as well as extended superiorly with blunt dissection.  The midline was easily identified,  separated, and peritoneum was entered bluntly.  This incision was extended with good visualization of the bladder.  Alexis skin retractor was placed carefully making sure that no bowel was entrapped.  The uterus was visualized and a transverse incision was made with a knife.  This ended up being a slightly high.  She will be counseled to deliver via cesarean section in the future.  The infant was deep into her pelvis.  The vertex was elevated and the baby re-settled into the pelvis with the neck flexed into the side, was the delivered with the aid of a vacuum.  After multiple attempts, the infant was delivered.  Nose and mouth were suctioned.  The infant was handed to the awaiting pediatric staff.  Nose and mouth were suctioned.  Cord was clamped and cut.  The placenta floated from the uterus.  The cord gas was obtained as well as cord blood.  The uterus was cleared of all clot and debris.  The uterine incision was closed in 2 layers with 0 Monocryl, the first which is running locked and the second as an imbricating layer.  The pelvis was copiously irrigated.  The [name] was removed, and the peritoneum was reapproximated with 2-0 Vicryl.  The  subfascial planes were inspected and found to be hemostatic.  The fascia was reapproximated with 0 Vicryl single suture in running.  The subcuticular adipose layer was made hemostatic with Bovie cautery.  The space was closed with 3-0 plain gut.  The skin was closed with 4-0 Vicryl in a subcuticular fashion with a [name] needle.  Benzoin and Steri- Strips applied.  Sponge, lap, and needle count was correct x2 per the operating staff.     Thornell Sartorius, MD     JB/MEDQ  D:  09/08/2013  T:  09/09/2013  Job:  [id number]

## 2013-09-09 NOTE — Lactation Note (Signed)
This note was copied from the chart of [name]. Lactation Consultation Note    Follow up consult with this mom and baby, now 53 hours old, and in NICU. Mom was trying t breast feed, but baby would not stay latched. I showed mom how to hand express, and she has lots of colostrum. Once the baby tasted the milk, she stayed latched ans suckled well. Mom knows to call for lactation as needed.  Patient Name: Tiffany Lillionna Nabi ITGPQ'D Date: 09/09/2013 Reason for consult: Follow-up assessment;NICU baby   Maternal Data    Feeding Feeding Type: Breast Fed Length of feed: 20 min  LATCH Score/Interventions Latch: Repeated attempts needed to sustain latch, nipple held in mouth throughout feeding, stimulation needed to elicit sucking reflex. (I showed mom ow to hand express, lots of colostrum, which enticed the baby to stay latch and suckle.) Intervention(s): Adjust position;Assist with latch;Breast massage;Breast compression  Audible Swallowing: A few with stimulation Intervention(s): Hand expression;Skin to skin  Type of Nipple: Everted at rest and after stimulation  Comfort (Breast/Nipple): Soft / non-tender     Hold (Positioning): Assistance needed to correctly position infant at breast and maintain latch. Intervention(s): Breastfeeding basics reviewed;Support Pillows;Position options;Skin to skin  LATCH Score: 7  Lactation Tools Discussed/Used     Consult Status Consult Status: Follow-up Date: 09/10/13 Follow-up type: In-patient    Tonna Corner 09/09/2013, 7:57 PM

## 2013-09-09 NOTE — Progress Notes (Signed)
09/09/13 1700  Clinical Encounter Type  Visited With Patient and family together (sister Evangeline)  Visit Type Follow-up;Spiritual support;Social support  Spiritual Encounters  Spiritual Needs Emotional  Stress Factors  Patient Stress Factors (unexpected NICU stay)   Followed up to introduce [facility] and chaplain availability.  [name] was in good spirits, reports good success with breastfeeding and lactation, and is hopeful that she can get baby girl upstairs with her prior to her own discharge.  Family appreciative of pastoral presence, reflective listening, encouragement.  [place], [place]

## 2013-09-09 NOTE — Addendum Note (Signed)
Addendum created 09/09/13 1027 by Asher Muir, CRNA   Modules edited: Notes Section   Notes Section:  File: [id number]

## 2013-09-09 NOTE — Lactation Note (Signed)
This note was copied from the chart of Tiffany Merian Wroe. Lactation Consultation Note   Follow up consult with this mom of a term baby, in NICU. I assisted mom with latching baby for the first time, in cross cradle hold. Mom has lots of colostrum, and the baby latched easily, with deep latch and strong suckles. She was pulling back initially, due to stuffy nose, but eventually this seemed to resolve.  Mom aware she will be called with the baby's  cues, and wants to avoid formula and even bottle feeding, if possible. Mom should not go home until Sunday, 7/26, and hopefully the baby will be able to be discharged to home with mom, on either Sunday or Monday, if all goes well. Mom agrees to this plan. Mom knows to call for questions/concerns.  Patient Name: [name] [id number] Date: 09/09/2013 Reason for consult: Follow-up assessment;NICU baby   Maternal Data Formula Feeding for Exclusion: Yes (baby in NICU) Infant to breast within first hour of birth: No Breastfeeding delayed due to:: Infant status Has patient been taught Hand Expression?: Yes Does the patient have breastfeeding experience prior to this delivery?: No  Feeding Feeding Type: Breast Fed Length of feed: 20 min  LATCH Score/Interventions Latch: Grasps breast easily, tongue down, lips flanged, rhythmical sucking.  Audible Swallowing: Spontaneous and intermittent Intervention(s): Skin to skin;Hand expression  Type of Nipple: Everted at rest and after stimulation  Comfort (Breast/Nipple): Soft / non-tender  Problem noted: Mild/Moderate discomfort;Filling Interventions (Filling): Massage;Frequent nursing;Double electric pump Interventions (Mild/moderate discomfort): Hand expression;Post-pump  Hold (Positioning): Assistance needed to correctly position infant at breast and maintain latch.  LATCH Score: 9  Lactation Tools Discussed/Used Tools: Pump Breast pump type: Double-Electric Breast Pump WIC Program: Yes (mom is  exclusively breast feeding in the nICU, and may not need a DEP, if baby goes home when mom does) Pump Review: Setup, frequency, and cleaning;Milk Storage;Other (comment) (switch to standard setting, pump for 15 minutes after breast feeding) Date initiated:: 09/08/13   Consult Status Consult Status: Follow-up Date: 09/10/13 Follow-up type: In-patient    Tonna Corner 09/09/2013, 1:14 PM

## 2013-09-09 NOTE — Progress Notes (Signed)
Subjective: Postpartum Day 1: Cesarean Delivery Patient reports incisional pain, tolerating PO and no problems voiding.  Pain controlled, nl lochia  Objective: Vital signs in last 24 hours: Temp:  [97.2 F (36.2 C)-100.1 F (37.8 C)] 98.3 F (36.8 C) (07/24 0555) Pulse Rate:  [68-91] 74 (07/24 0555) Resp:  [14-20] 18 (07/24 0555) BP: (86-140)/(53-90) 95/53 mmHg (07/24 0555) SpO2:  [97 %-100 %] 99 % (07/24 0555) Weight:  [69.4 kg (153 lb)] 69.4 kg (153 lb) (07/23 1143)  Physical Exam:  General: alert and no distress Lochia: appropriate Uterine Fundus: firm Incision: healing well DVT Evaluation: No evidence of DVT seen on physical exam.   Recent Labs  09/08/13 1200 09/09/13 0503  HGB 11.6* 9.3*  HCT 34.1* 27.6*    Assessment/Plan: Status post Cesarean section. Doing well postoperatively.  Continue current care.  Routine care.  Baby doing well in NICU on RA, no abx  Bovard-Stuckert, Alfons Sulkowski 09/09/2013, 7:40 AM

## 2013-09-09 NOTE — Addendum Note (Signed)
Addendum created 09/09/13 1024 by Asher Muir, CRNA   Modules edited: Notes Section   Notes Section:  File: [id number]

## 2013-09-09 NOTE — Anesthesia Postprocedure Evaluation (Addendum)
Anesthesia Post Note  Patient: [name]  Procedure(s) Performed: Procedure(s) (LRB): CESAREAN SECTION (N/A)  Anesthesia type: Epidural  Patient location: [facility]  Post pain: Pain level controlled  Post assessment: Post-op Vital signs reviewed  Last Vitals:  Filed Vitals:   09/09/13 0954  BP: 112/60  Pulse: 65  Temp: 37 C  Resp: 18    Post vital signs: Reviewed  Level of consciousness:alert  Complications: No apparent anesthesia complications

## 2013-09-10 LAB — RUBELLA SCREEN: RUBELLA: 1.35 {index} — AB (ref <0.90)

## 2013-09-10 NOTE — Plan of Care (Signed)
Problem: Discharge Progression Outcomes Goal: Activity appropriate for discharge plan Outcome: Completed/Met Date Met:  09/10/13 Walks to NICU frequently and tolerates well. Goal: Tolerating diet Outcome: Completed/Met Date Met:  09/10/13 Tolerating a Regular diet well. Goal: Pain controlled with appropriate interventions Outcome: Completed/Met Date Met:  09/10/13 Good pain control on po Motrin and Percocet.

## 2013-09-10 NOTE — Progress Notes (Signed)
Patient ID: [name], female   DOB: August 06, 1985, 28 y.o.   MRN: [id number] #2 afebrile BP normal Some incisional pain

## 2013-09-11 MED ORDER — OXYCODONE-ACETAMINOPHEN 5-325 MG PO TABS: 1.0000 | ORAL_TABLET | Freq: Four times a day (QID) | ORAL | Status: DC | PRN

## 2013-09-11 MED ORDER — IBUPROFEN 600 MG PO TABS: 600.0000 mg | ORAL_TABLET | Freq: Four times a day (QID) | ORAL | Status: DC | PRN

## 2013-09-11 NOTE — Progress Notes (Signed)
Patient ID: [name], female   DOB: May 09, 1985, 28 y.o.   MRN: [id number] #3 afebrile BP normal Baby may be able to be d/c ed but if not pt will be a room in in NICU

## 2013-09-11 NOTE — Progress Notes (Signed)
Discharge instructions provided to patient and significant other at bedside.  Medications, activity, follow up appointments, when to call the doctor and community resources discussed.  No questions at this time.  Patient left unit in stable condition with all personal belongings and prescriptions accompanied by staff.  Leighton Roach, RN------

## 2013-09-11 NOTE — Discharge Instructions (Signed)
booklet °

## 2013-09-12 NOTE — Discharge Summary (Signed)
[name], [name]                ACCOUNT NO.:  [id number]  MEDICAL RECORD NO.:  [id number]  LOCATION:  9305                          FACILITY:  [facility]  PHYSICIAN:  Lucille Passy. Ulanda Edison, M.D. DATE OF BIRTH:  [birth year]  DATE OF ADMISSION:  09/08/2013 DATE OF DISCHARGE:  09/11/2013                              DISCHARGE SUMMARY   HOSPITAL COURSE:  This is a 28 year old black female, para 0-0-1-0, gravida 2, at 18 weeks, admitted in early labor.  The patient was seen in the office with contractions, increasing in intensity.  The patient's pregnancy was complicated by increased risk of Down syndrome, with normal 65 XX, amniocentesis.  She also had a shortened cervix at about 24 weeks, got betamethasone at 24 weeks, size less than dates, was followed with ultrasound, good growth about 34th  percentile, also had a 2-vessel cord and echogenic intracardiac focus.  Also, had calcifications in the fetal liver.  The patient was treated with vaginal progesterone.  After admission to the hospital, the patient had a long deceleration into the 50s.  Fetal scalp electrode was placed. Artificial rupture of membranes performed with bloody fluid.  Heart rate recovered into the 150s.  The patient was shortly taken to the operating room because of a likely abruption and nonreassuring fetal heart rate tracing.  The patient underwent a low-transverse cervical C-section by Dr. Melba Coon, under epidural anesthesia.  The infant has done well. Postoperatively, the patient did quite well and was discharged on the third postop day.  It is uncertain at this time, whether the baby will be able to be discharged, but if not, the patient can room in the NICU. Her initial hemoglobin was 11.6, hematocrit 34.1, white count 8400, platelet count 242,000.  Followup hemoglobin 9.3, platelet count 201,000.  FINAL DIAGNOSES: 1. Intrauterine pregnancy, 41 weeks, delivered by cesarean section. 2. Abruption of the  placenta.  OPERATION:  Low-transverse cervical cesarean section.  FINAL CONDITION:  Improved.  INSTRUCTIONS:  Include our regular discharge instruction booklet as well as our after-visit summary.  Prescriptions for Percocet 5/325, 30 tablets, 1 or 2 every 6 hours as needed for pain and Motrin 600 mg 30 tablets, 1 every 6 hours as needed for pain, and she is to return to the office in 2 weeks for followup examination.  She was also advised to take an iron pill once a day.     Lucille Passy. Ulanda Edison, M.D.     TFH/MEDQ  D:  09/11/2013  T:  09/11/2013  Job:  [id number]

## 2013-12-19 ENCOUNTER — Encounter (HOSPITAL_COMMUNITY): Payer: Self-pay | Admitting: Obstetrics and Gynecology

## 2015-10-18 IMAGING — US US FETAL BPP W/O NONSTRESS
1 series · 13 of 15 positions shown · non-contrast
Comparison: none

[Series 1: us fetal bpp w/o nonstress · non-contrast · 15 acquisitions, 13 frames shown]
[im 1/15]
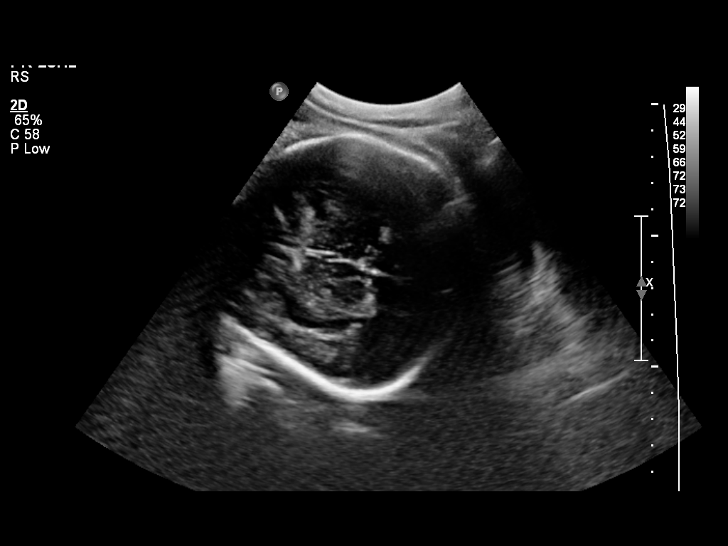
[im 2/15]
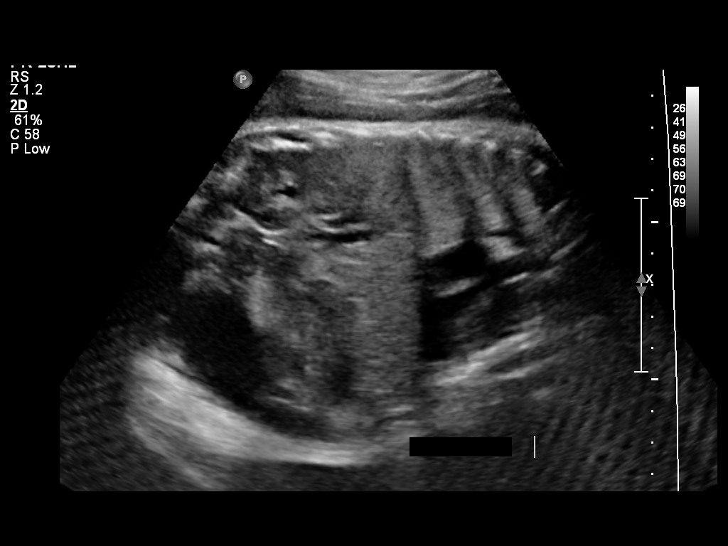
[im 3/15]
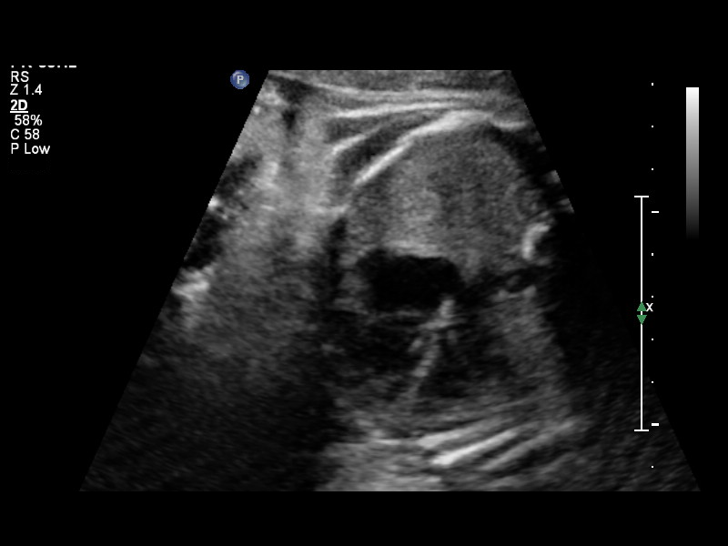
[im 5/15]
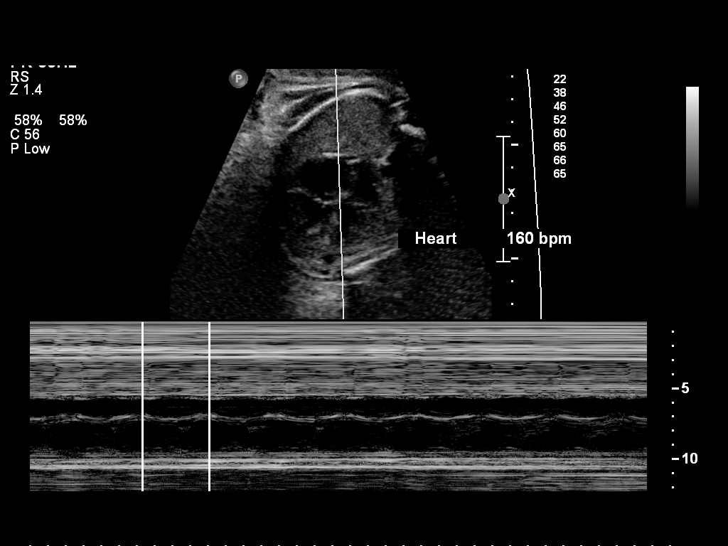
[im 6/15]
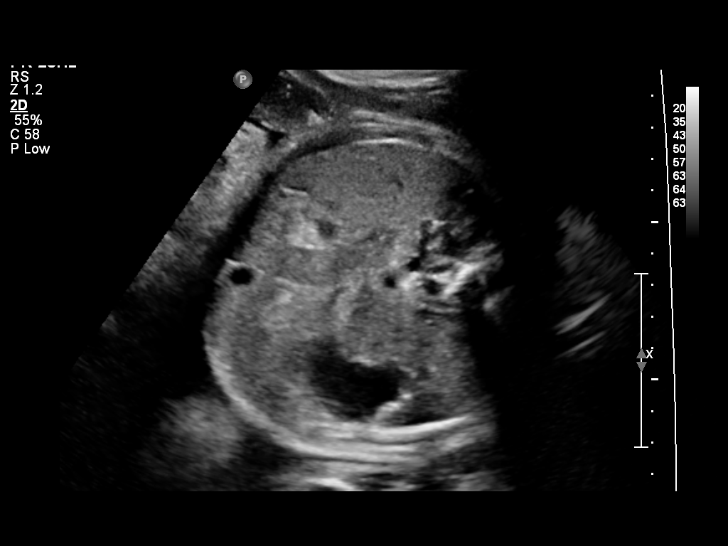
[im 7/15]
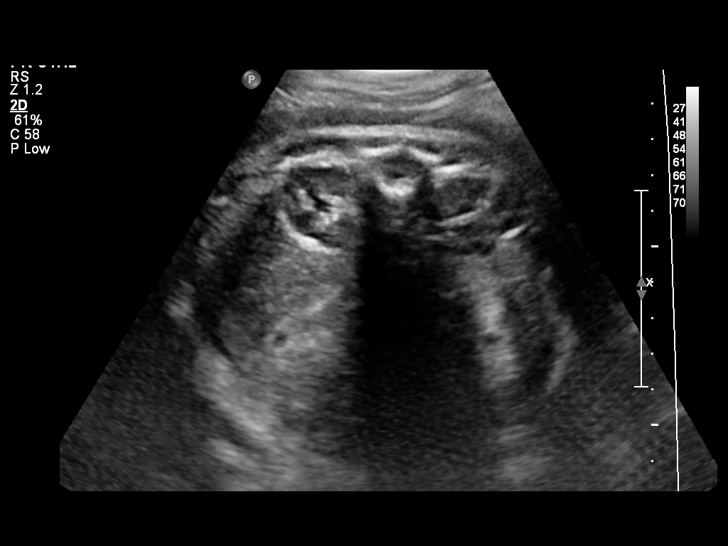
[im 8/15]
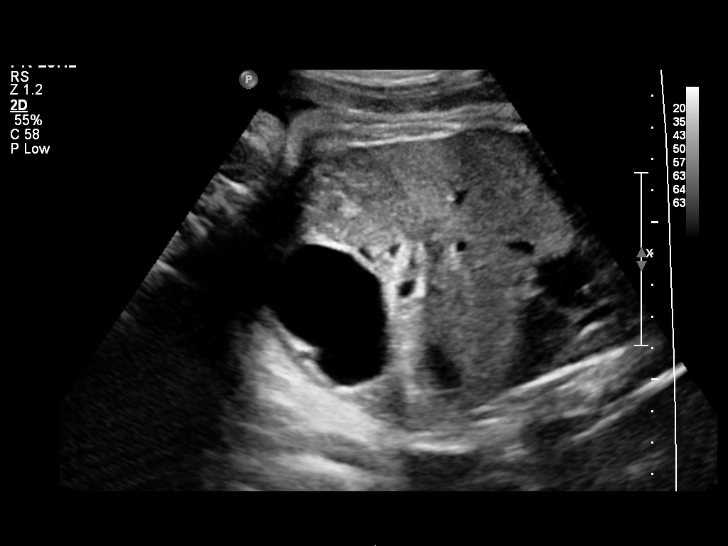
[im 9/15]
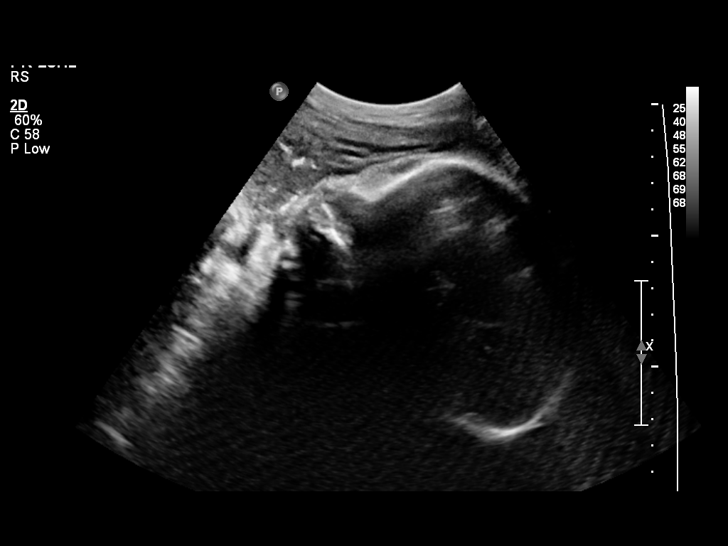
[im 10/15]
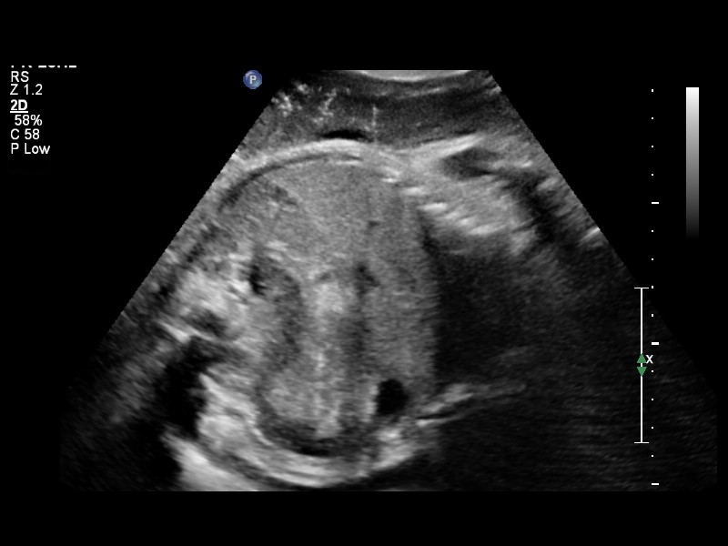
[im 11/15]
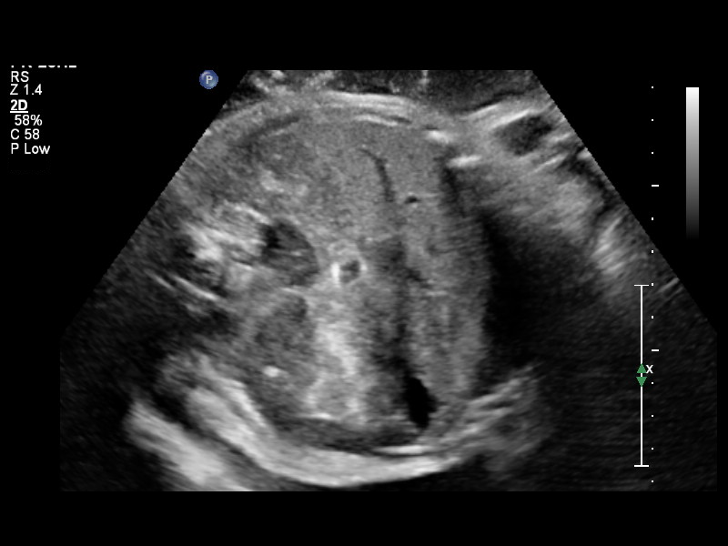
[im 13/15]
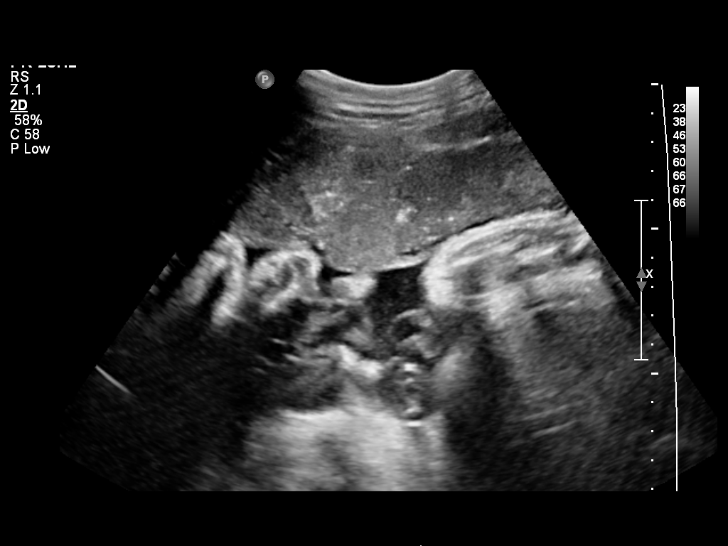
[im 14/15]
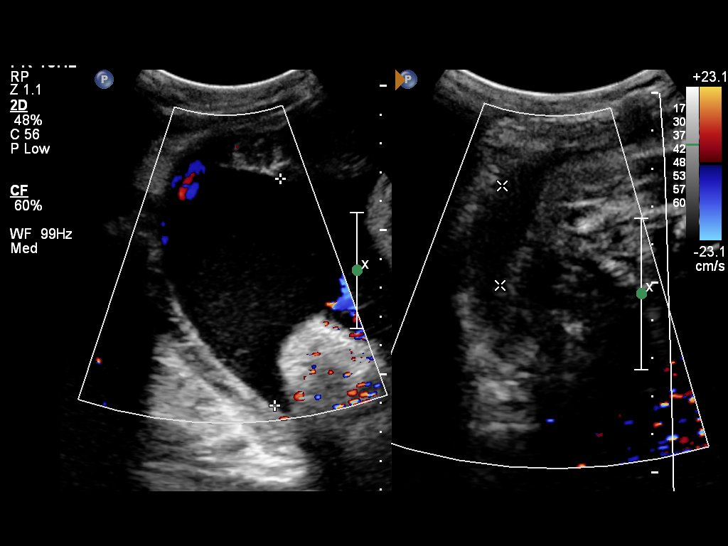
[im 15/15]
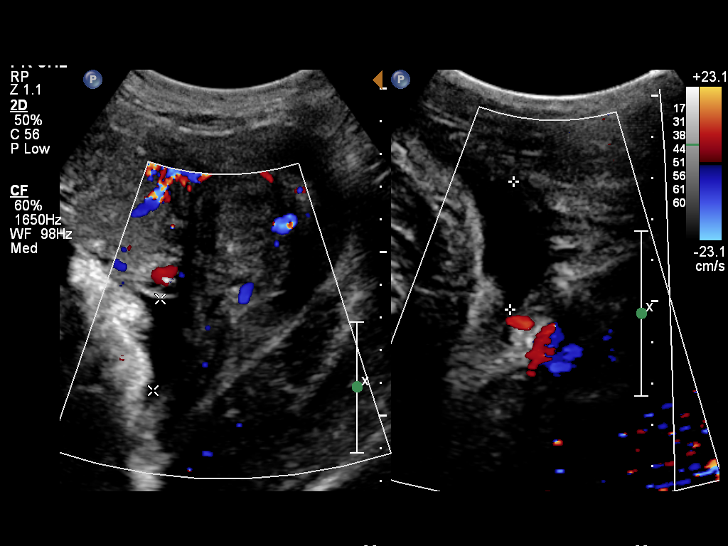

[13 of 15 positions shown; findings below may reference images not displayed]

OBSTETRICS REPORT
                      (Signed Final 08/22/2013 [DATE])

Service(s) Provided

Indications

 Non-reactive NST
 Size less than dates (Small for gestational [AGE]
 FGR)f
Fetal Evaluation

 Num Of Fetuses:    1
 Fetal Heart Rate:  160                          bpm
 Cardiac Activity:  Observed
 Presentation:      Cephalic
 Placenta:          Anterior, above cervical os
 P. Cord            Previously seen as normal
 Insertion:

 Amniotic Fluid
 AFI FV:      Subjectively within normal limits
 AFI Sum:     16.34   cm       64  %Tile     Larg Pckt:    7.83  cm
 RUQ:   7.83    cm   RLQ:    2.8    cm    LUQ:   2.61    cm   LLQ:    3.1    cm
Biophysical Evaluation

 Amniotic F.V:   Within normal limits       F. Tone:        Observed
 F. Movement:    Observed                   Score:          [DATE]
 F. Breathing:   Observed
Gestational Age

 LMP:           38w 2d        Date:  11/27/12                 EDD:   09/03/13
 Best:          38w 2d     Det. By:  LMP  (11/27/12)          EDD:   09/03/13
Impression

 Single IUP at 38w 2d
 BPP [DATE]
 Normal amniotic fluid volume
Recommendations

 Follow-up ultrasounds as clinically indicated.
 questions or concerns.

## 2019-08-18 DIAGNOSIS — R87613 High grade squamous intraepithelial lesion on cytologic smear of cervix (HGSIL): Secondary | ICD-10-CM

## 2019-08-18 HISTORY — DX: High grade squamous intraepithelial lesion on cytologic smear of cervix (HGSIL): R87.613

## 2019-09-21 ENCOUNTER — Other Ambulatory Visit: Payer: Self-pay | Admitting: Obstetrics and Gynecology

## 2019-09-28 ENCOUNTER — Encounter (HOSPITAL_BASED_OUTPATIENT_CLINIC_OR_DEPARTMENT_OTHER): Payer: Self-pay | Admitting: Obstetrics and Gynecology

## 2019-09-28 ENCOUNTER — Other Ambulatory Visit: Payer: Self-pay

## 2019-09-28 NOTE — Progress Notes (Signed)
Spoke w/ via phone for pre-op interview--- PT Lab needs dos---- Urine preg              Lab results------ pt getting CBCdiff, CMP, T&S done 10-04-2019 @ 0900 COVID test ------ 10-04-2019 @ 1000 Arrive at ------- 0745 NPO after MN NO Solid Food.  Clear liquids from MN until--- 0645 Medications to take morning of surgery ----- NONE Diabetic medication ----- n/a Patient Special Instructions ----- n/a Pre-Op special Istructions ----- n/a Patient verbalized understanding of instructions that were given at this phone interview. Patient denies shortness of breath, chest pain, fever, cough at this phone interview.

## 2019-10-03 NOTE — H&P (Signed)
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  This patient had a normal Pap with positive HPV in 2020.  She chose to repeat co-testing in 1 year.  When it was repeated on 07/2/201, the Pap smear again was normal, HPV positive.  She was recommended cervical biopsies.   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  Immunohistochemistry for P16 supported the diagnosis and endocervical curettage on the same date revealed scant dysplastic  squamous epithelium.  The doctor who read the slides was Dr. Jeannie Done.  I called her to have her review the slides to be sure that she felt like there was high-grade abnormality.  She affirmed the diagnosis of high-grade SIL, so I advised conization of the  cervix.  The patient's last Depo injection Depo-Provera was on 07/12/2019.  PAST MEDICAL HISTORY:  Reveals NO KNOWN ALLERGIES.  She has used the Depo-Provera for birth control.  FAMILY HISTORY:  Mother, malignant tumor of the pancreas.  SOCIAL HISTORY:  The patient apparently smokes 1-2 cigars a day, drinks caffeine moderately occasional alcohol intake.  Denies illicit drugs.  She works for [employer].  She is single and sexually active.  PAST SURGICAL HISTORY:  She did have a C-section in 08/2013 for likely abruption.  She also had an abortion in 2006 at 3 months.  She does not bleed on the Depo-Provera.  She is heterosexual.  She has had chlamydia in 2008.  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  OPERATIONS:  D and C and cesarean section.  PHYSICAL EXAMINATION: VITAL SIGNS:  She is 5 feet 4 inches and 151 pounds, BMI of 25.9, blood pressure 96/60, pulse 82, temperature 97.8. HEENT:  No cranial abnormalities.  Extraocular movements intact.  Nose and pharynx clear. NECK:  Supple, without thyromegaly. BREASTS:  No masses, tenderness or discharge.  They are symmetrical. ABDOMEN:  Soft and nontender.  No masses are present.   PELVIC:  Vulva and vagina clean.  Cervix clean.  Uterus anterior, normal size.  Adnexa clear.  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  Because the patient had a normal Pap and positive HPV on consecutive Pap smears 1 year apart, and this  colposcopy is unsatisfactory, I asked the pathologist to review the slides to be sure that we needed to proceed with the surgery.  She did review the slides and confirmed that she saw high-grade squamous intraepithelial lesions.  The patient  has been  informed.  She understands that the procedure might show no pathology, but we are sort of forced to do the procedure because of the abnormality present.  She understands this and is ready to proceed.  I have counseled her about the risks of surgery and  she has no questions.  VN/NUANCE  D:09/30/2019 T:09/30/2019 JOB:[id number]

## 2019-10-04 ENCOUNTER — Encounter (HOSPITAL_COMMUNITY)
Admission: RE | Admit: 2019-10-04 | Discharge: 2019-10-04 | Disposition: A | Discharge status: Home / Self Care | Payer: BC Managed Care – PPO | Source: Ambulatory Visit | Attending: Obstetrics and Gynecology | Admitting: Obstetrics and Gynecology

## 2019-10-04 ENCOUNTER — Other Ambulatory Visit (HOSPITAL_COMMUNITY)
Admission: RE | Admit: 2019-10-04 | Discharge: 2019-10-04 | Disposition: A | Discharge status: Home / Self Care | Payer: BC Managed Care – PPO | Source: Ambulatory Visit | Attending: Obstetrics and Gynecology | Admitting: Obstetrics and Gynecology

## 2019-10-04 DIAGNOSIS — Z20822 Contact with and (suspected) exposure to covid-19: Secondary | ICD-10-CM | POA: Insufficient documentation

## 2019-10-04 DIAGNOSIS — Z01812 Encounter for preprocedural laboratory examination: Secondary | ICD-10-CM | POA: Insufficient documentation

## 2019-10-04 LAB — SARS CORONAVIRUS 2 (TAT 6-24 HRS): SARS Coronavirus 2: NEGATIVE

## 2019-10-04 NOTE — Progress Notes (Addendum)
Patient arrived for pre op lab appointment with infant . Pateitn going for covid test this am and will be same day labs day of surgery.Patient needs cbc with dif, cmet, type and screen day of surgery.

## 2019-10-06 ENCOUNTER — Ambulatory Visit (HOSPITAL_BASED_OUTPATIENT_CLINIC_OR_DEPARTMENT_OTHER): Payer: BC Managed Care – PPO | Admitting: Anesthesiology

## 2019-10-06 ENCOUNTER — Encounter (HOSPITAL_BASED_OUTPATIENT_CLINIC_OR_DEPARTMENT_OTHER)
Admission: RE | Disposition: A | Discharge status: Home / Self Care | Payer: Self-pay | Source: Home / Self Care | Attending: Obstetrics and Gynecology

## 2019-10-06 ENCOUNTER — Encounter (HOSPITAL_BASED_OUTPATIENT_CLINIC_OR_DEPARTMENT_OTHER): Payer: Self-pay | Admitting: Obstetrics and Gynecology

## 2019-10-06 ENCOUNTER — Ambulatory Visit (HOSPITAL_BASED_OUTPATIENT_CLINIC_OR_DEPARTMENT_OTHER)
Admission: RE | Admit: 2019-10-06 | Discharge: 2019-10-06 | Disposition: A | Discharge status: Home / Self Care | Payer: BC Managed Care – PPO | Attending: Obstetrics and Gynecology | Admitting: Obstetrics and Gynecology

## 2019-10-06 DIAGNOSIS — F17290 Nicotine dependence, other tobacco product, uncomplicated: Secondary | ICD-10-CM | POA: Insufficient documentation

## 2019-10-06 DIAGNOSIS — D069 Carcinoma in situ of cervix, unspecified: Secondary | ICD-10-CM | POA: Insufficient documentation

## 2019-10-06 DIAGNOSIS — R87613 High grade squamous intraepithelial lesion on cytologic smear of cervix (HGSIL): Secondary | ICD-10-CM | POA: Diagnosis present

## 2019-10-06 DIAGNOSIS — D573 Sickle-cell trait: Secondary | ICD-10-CM | POA: Diagnosis not present

## 2019-10-06 HISTORY — PX: DILATION AND CURETTAGE OF UTERUS: SHX78

## 2019-10-06 HISTORY — DX: Personal history of other infectious and parasitic diseases: Z86.19

## 2019-10-06 HISTORY — DX: Presence of spectacles and contact lenses: Z97.3

## 2019-10-06 HISTORY — PX: CERVICAL CONIZATION W/BX: SHX1330

## 2019-10-06 LAB — CBC WITH DIFFERENTIAL/PLATELET
Abs Immature Granulocytes: 0.02 10*3/uL (ref 0.00–0.07)
Basophils Absolute: 0 10*3/uL (ref 0.0–0.1)
Basophils Relative: 1 %
Eosinophils Absolute: 0.2 10*3/uL (ref 0.0–0.5)
Eosinophils Relative: 4 %
HCT: 40.6 % (ref 36.0–46.0)
Hemoglobin: 13.9 g/dL (ref 12.0–15.0)
Immature Granulocytes: 0 %
Lymphocytes Relative: 26 %
Lymphs Abs: 1.4 10*3/uL (ref 0.7–4.0)
MCH: 31.2 pg (ref 26.0–34.0)
MCHC: 34.2 g/dL (ref 30.0–36.0)
MCV: 91 fL (ref 80.0–100.0)
Monocytes Absolute: 0.3 10*3/uL (ref 0.1–1.0)
Monocytes Relative: 6 %
Neutro Abs: 3.5 10*3/uL (ref 1.7–7.7)
Neutrophils Relative %: 63 %
Platelets: 263 10*3/uL (ref 150–400)
RBC: 4.46 MIL/uL (ref 3.87–5.11)
RDW: 13.5 % (ref 11.5–15.5)
WBC: 5.5 10*3/uL (ref 4.0–10.5)
nRBC: 0 % (ref 0.0–0.2)

## 2019-10-06 LAB — COMPREHENSIVE METABOLIC PANEL
ALT: 20 U/L (ref 0–44)
AST: 19 U/L (ref 15–41)
Albumin: 4.5 g/dL (ref 3.5–5.0)
Alkaline Phosphatase: 44 U/L (ref 38–126)
Anion gap: 7 (ref 5–15)
BUN: 16 mg/dL (ref 6–20)
CO2: 26 mmol/L (ref 22–32)
Calcium: 9.3 mg/dL (ref 8.9–10.3)
Chloride: 105 mmol/L (ref 98–111)
Creatinine, Ser: 0.8 mg/dL (ref 0.44–1.00)
GFR calc Af Amer: >60 mL/min (ref >60)
GFR calc non Af Amer: >60 mL/min (ref >60)
Glucose, Bld: 97 mg/dL (ref 70–99)
Potassium: 3.9 mmol/L (ref 3.5–5.1)
Sodium: 138 mmol/L (ref 135–145)
Total Bilirubin: 0.5 mg/dL (ref 0.3–1.2)
Total Protein: 8.3 g/dL — ABNORMAL HIGH (ref 6.5–8.1)

## 2019-10-06 LAB — POCT PREGNANCY, URINE: Preg Test, Ur: NEGATIVE

## 2019-10-06 SURGERY — CONE BIOPSY, CERVIX
Anesthesia: General | Site: Vagina

## 2019-10-06 MED ORDER — LIDOCAINE 2% (20 MG/ML) 5 ML SYRINGE
INTRAMUSCULAR | Status: DC | PRN
  Administered 2019-10-06: 30 mg via INTRAVENOUS

## 2019-10-06 MED ORDER — LACTATED RINGERS IV SOLN: INTRAVENOUS | Status: DC

## 2019-10-06 MED ORDER — FENTANYL CITRATE (PF) 100 MCG/2ML IJ SOLN: 25.0000 ug | INTRAMUSCULAR | Status: DC | PRN

## 2019-10-06 MED ORDER — SODIUM CHLORIDE (PF) 0.9 % IJ SOLN
INTRAMUSCULAR | Status: DC | PRN
  Administered 2019-10-06: 100 mL

## 2019-10-06 MED ORDER — VASOPRESSIN 20 UNIT/ML IV SOLN
INTRAVENOUS | Status: DC | PRN
  Administered 2019-10-06: 10 [IU]

## 2019-10-06 MED ORDER — ACETAMINOPHEN 500 MG PO TABS
ORAL_TABLET | ORAL | Status: AC
  Filled 2019-10-06: qty 2

## 2019-10-06 MED ORDER — SCOPOLAMINE 1 MG/3DAYS TD PT72
MEDICATED_PATCH | TRANSDERMAL | Status: AC
  Filled 2019-10-06: qty 1

## 2019-10-06 MED ORDER — DEXAMETHASONE SODIUM PHOSPHATE 10 MG/ML IJ SOLN
INTRAMUSCULAR | Status: DC | PRN
  Administered 2019-10-06: 4 mg via INTRAVENOUS

## 2019-10-06 MED ORDER — LIDOCAINE 2% (20 MG/ML) 5 ML SYRINGE
INTRAMUSCULAR | Status: AC
  Filled 2019-10-06: qty 5

## 2019-10-06 MED ORDER — ACETAMINOPHEN 500 MG PO TABS
1000.0000 mg | ORAL_TABLET | Freq: Once | ORAL | Status: AC
  Administered 2019-10-06: 1000 mg via ORAL

## 2019-10-06 MED ORDER — OXYCODONE HCL 5 MG PO TABS: 5.0000 mg | ORAL_TABLET | Freq: Once | ORAL | Status: DC | PRN

## 2019-10-06 MED ORDER — FENTANYL CITRATE (PF) 100 MCG/2ML IJ SOLN
INTRAMUSCULAR | Status: AC
  Filled 2019-10-06: qty 2

## 2019-10-06 MED ORDER — OXYCODONE HCL 5 MG/5ML PO SOLN: 5.0000 mg | Freq: Once | ORAL | Status: DC | PRN

## 2019-10-06 MED ORDER — MIDAZOLAM HCL 5 MG/5ML IJ SOLN
INTRAMUSCULAR | Status: DC | PRN
  Administered 2019-10-06: 2 mg via INTRAVENOUS

## 2019-10-06 MED ORDER — FERRIC SUBSULFATE SOLN
Status: DC | PRN
  Administered 2019-10-06: 1

## 2019-10-06 MED ORDER — ONDANSETRON HCL 4 MG/2ML IJ SOLN
INTRAMUSCULAR | Status: AC
  Filled 2019-10-06: qty 2

## 2019-10-06 MED ORDER — MIDAZOLAM HCL 2 MG/2ML IJ SOLN
INTRAMUSCULAR | Status: AC
  Filled 2019-10-06: qty 2

## 2019-10-06 MED ORDER — PROPOFOL 10 MG/ML IV BOLUS
INTRAVENOUS | Status: DC | PRN
  Administered 2019-10-06: 200 mg via INTRAVENOUS

## 2019-10-06 MED ORDER — KETOROLAC TROMETHAMINE 30 MG/ML IJ SOLN
INTRAMUSCULAR | Status: AC
  Filled 2019-10-06: qty 1

## 2019-10-06 MED ORDER — FENTANYL CITRATE (PF) 250 MCG/5ML IJ SOLN
INTRAMUSCULAR | Status: DC | PRN
Start: 2019-10-06 — End: 2019-10-06
  Administered 2019-10-06 (×2): 50 ug via INTRAVENOUS

## 2019-10-06 MED ORDER — SCOPOLAMINE 1 MG/3DAYS TD PT72
1.0000 | MEDICATED_PATCH | TRANSDERMAL | Status: DC
  Administered 2019-10-06: 1.5 mg via TRANSDERMAL

## 2019-10-06 MED ORDER — DEXAMETHASONE SODIUM PHOSPHATE 10 MG/ML IJ SOLN
INTRAMUSCULAR | Status: AC
  Filled 2019-10-06: qty 1

## 2019-10-06 MED ORDER — ONDANSETRON HCL 4 MG/2ML IJ SOLN
INTRAMUSCULAR | Status: DC | PRN
  Administered 2019-10-06: 4 mg via INTRAVENOUS

## 2019-10-06 MED ORDER — PROPOFOL 10 MG/ML IV BOLUS
INTRAVENOUS | Status: AC
  Filled 2019-10-06: qty 20

## 2019-10-06 MED ORDER — MEPERIDINE HCL 25 MG/ML IJ SOLN: 6.2500 mg | INTRAMUSCULAR | Status: DC | PRN

## 2019-10-06 MED ORDER — MIDAZOLAM HCL 2 MG/2ML IJ SOLN: 0.5000 mg | Freq: Once | INTRAMUSCULAR | Status: DC | PRN

## 2019-10-06 MED ORDER — PROMETHAZINE HCL 25 MG/ML IJ SOLN: 6.2500 mg | INTRAMUSCULAR | Status: DC | PRN

## 2019-10-06 SURGICAL SUPPLY — 48 items
APL SWBSTK 6 STRL LF DISP (MISCELLANEOUS) ×2
APPLICATOR COTTON TIP 6 STRL (MISCELLANEOUS) ×2 IMPLANT
APPLICATOR COTTON TIP 6IN STRL (MISCELLANEOUS) ×6
BAG DECANTER FOR FLEXI CONT (MISCELLANEOUS) ×3 IMPLANT
BLADE SURG 11 STRL SS (BLADE) IMPLANT
BLADE SURG 15 STRL LF DISP TIS (BLADE) IMPLANT
BLADE SURG 15 STRL SS (BLADE)
CANISTER SUCT 1200ML W/VALVE (MISCELLANEOUS) IMPLANT
CANISTER SUCT 3000ML PPV (MISCELLANEOUS) IMPLANT
CATH ROBINSON RED A/P 16FR (CATHETERS) IMPLANT
CLOTH BEACON ORANGE TIMEOUT ST (SAFETY) ×3 IMPLANT
COVER BACK TABLE 60X90IN (DRAPES) ×3 IMPLANT
COVER WAND RF STERILE (DRAPES) ×3 IMPLANT
DRAPE HYSTEROSCOPY (DRAPE) ×3 IMPLANT
DRAPE SHEET LG 3/4 BI-LAMINATE (DRAPES) ×3 IMPLANT
DRSG TELFA 3X8 NADH (GAUZE/BANDAGES/DRESSINGS) ×3 IMPLANT
ELECT BALL LEEP 3MM BLK (ELECTRODE) IMPLANT
ELECT BALL LEEP 5MM RED (ELECTRODE) IMPLANT
ELECT NEEDLE TIP 2.8 STRL (NEEDLE) IMPLANT
ELECT REM PT RETURN 9FT ADLT (ELECTROSURGICAL) ×3
ELECTRODE REM PT RTRN 9FT ADLT (ELECTROSURGICAL) ×1 IMPLANT
GLOVE BIO SURGEON STRL SZ7 (GLOVE) ×6 IMPLANT
GLOVE BIOGEL PI IND STRL 7.5 (GLOVE) ×1 IMPLANT
GLOVE BIOGEL PI INDICATOR 7.5 (GLOVE) ×2
GLOVE ECLIPSE 7.5 STRL STRAW (GLOVE) ×6 IMPLANT
GLOVE ECLIPSE 8.0 STRL XLNG CF (GLOVE) ×3 IMPLANT
GOWN STRL REUS W/TWL LRG LVL3 (GOWN DISPOSABLE) ×6 IMPLANT
KIT TURNOVER CYSTO (KITS) ×3 IMPLANT
LEGGING LITHOTOMY PAIR STRL (DRAPES) ×3 IMPLANT
MANIFOLD NEPTUNE II (INSTRUMENTS) IMPLANT
NDL SAFETY ECLIPSE 18X1.5 (NEEDLE) ×1 IMPLANT
NEEDLE HYPO 18GX1.5 SHARP (NEEDLE) ×3
NS IRRIG 500ML POUR BTL (IV SOLUTION) ×3 IMPLANT
PACK BASIN DAY SURGERY FS (CUSTOM PROCEDURE TRAY) ×3 IMPLANT
PAD OB MATERNITY 4.3X12.25 (PERSONAL CARE ITEMS) ×3 IMPLANT
PAD PREP 24X48 CUFFED NSTRL (MISCELLANEOUS) ×3 IMPLANT
PENCIL SMOKE EVACUATOR (MISCELLANEOUS) IMPLANT
SCOPETTES 8  STERILE (MISCELLANEOUS)
SCOPETTES 8 STERILE (MISCELLANEOUS) IMPLANT
SURGILUBE 2OZ TUBE FLIPTOP (MISCELLANEOUS) ×3 IMPLANT
SUT CHROMIC 0 CT 1 (SUTURE) ×6 IMPLANT
SYR TB 1ML LL NO SAFETY (SYRINGE) ×3 IMPLANT
TOWEL OR 17X26 10 PK STRL BLUE (TOWEL DISPOSABLE) ×6 IMPLANT
TRAY DSU PREP LF (CUSTOM PROCEDURE TRAY) ×3 IMPLANT
TUBE CONNECTING 12'X1/4 (SUCTIONS) ×1
TUBE CONNECTING 12X1/4 (SUCTIONS) ×2 IMPLANT
WATER STERILE IRR 500ML POUR (IV SOLUTION) ×3 IMPLANT
YANKAUER SUCT BULB TIP NO VENT (SUCTIONS) IMPLANT

## 2019-10-06 NOTE — Discharge Instructions (Signed)

## 2019-10-06 NOTE — Anesthesia Procedure Notes (Signed)
Procedure Name: LMA Insertion Date/Time: 10/06/2019 9:42 AM Performed by: Myna Bright, CRNA Pre-anesthesia Checklist: Patient identified, Emergency Drugs available, Suction available and Patient being monitored Patient Re-evaluated:Patient Re-evaluated prior to induction Oxygen Delivery Method: Circle system utilized Preoxygenation: Pre-oxygenation with 100% oxygen Induction Type: IV induction Ventilation: Mask ventilation without difficulty LMA: LMA inserted LMA Size: 4.0 Number of attempts: 1 Placement Confirmation: positive ETCO2 and breath sounds checked- equal and bilateral Tube secured with: Tape

## 2019-10-06 NOTE — Anesthesia Postprocedure Evaluation (Signed)
Anesthesia Post Note  Patient: Norleen Xie  Procedure(s) Performed: CONIZATION CERVIX WITH BIOPSY (N/A Vagina ) DILATATION AND CURETTAGE (N/A Vagina )     Patient location during evaluation: Phase II Anesthesia Type: General Level of consciousness: awake and alert, patient cooperative and oriented Pain management: pain level controlled Vital Signs Assessment: post-procedure vital signs reviewed and stable Respiratory status: spontaneous breathing, nonlabored ventilation and respiratory function stable Cardiovascular status: blood pressure returned to baseline and stable Postop Assessment: no apparent nausea or vomiting, able to ambulate and adequate PO intake Anesthetic complications: no   No complications documented.  Last Vitals:  Vitals:   10/06/19 1054 10/06/19 1152  BP: (!) 131/94 (!) 106/59  Pulse:  78  Resp: 17 18  Temp:  37.1 C  SpO2:  100%    Last Pain:  Vitals:   10/06/19 1152  TempSrc: Oral  PainSc:                  [place] Doster,E. Briar Witherspoon

## 2019-10-06 NOTE — Op Note (Signed)
NAMEPRAKRITI, CARIGNAN MEDICAL RECORD [id number] ACCOUNT [id number] DATE OF BIRTH:[birth year] FACILITY: [facility] LOCATION: [facility] PHYSICIAN:Jaycee Pelzer Arlean Hopping, MD  OPERATIVE REPORT  DATE OF PROCEDURE:  10/06/2019  PREOPERATIVE DIAGNOSIS:  High-grade squamous intraepithelial lesion of the cervix.  POSTOPERATIVE DIAGNOSIS:  High-grade squamous intraepithelial lesion of the cervix.  OPERATION: 1.  Dilatation and curettage. 2.  Conization of the cervix.    SURGEON:  Newton Pigg, MD   ANESTHESIA:  General anesthesia.  DESCRIPTION OF PROCEDURE:  The patient was brought to the operating room and placed under satisfactory general anesthesia and in the lithotomy position.  She had the sequential compression devices in place.  Exam revealed the uterus to be normal size,  posterior.  Adnexa were free of masses.  The cervix, vagina and surrounding tissues were prepped with Betadine solution.  The bladder was emptied with a [place] catheter and the area was draped as a sterile field.  A weighted speculum was placed  posteriorly, then a Sims retractor anteriorly.  The cervix was grasped on the anterior cervical lip with a single-tooth tenaculum and drawn into the operative field.  A dilute solution of Pitressin mixed with 10 units of Pitressin in 100 mL of saline was  used to inject the cervix circumferentially.  A total of 10 mL was used.  At this point, a conization was done with the knife, a 15 blade, going all the way around the cervix and trying to go up into the endocervical canal.  After the specimen was  removed, it was cut at 6 o'clock and endocervical curettage was then done, producing minimal tissue.  Cervix was dilated with a Pratt dilator so that a small curette could be inserted and a curettage was done, preserving all the tissue that I  found.  At this point, the cervix was reconstructed with 4 interrupted sutures of 0 chromic starting at 5 to 7 o'clock going to 11 to 1 o'clock, then  2 to  4 o'clock and 8 to 10 o'clock.  Prior to placement of all the sutures, I did try to establish  hemostasis with the cautery.  The point that gave the most trouble was at 8 o'clock on the cervix, but ultimately with the Bovie and with the suture from 8 to 10 o'clock, bleeding was completely controlled and there was no bleeding at all at the end of  the procedure.  Monsel solution was placed into the endocervical canal and the procedure was terminated.  Blood loss was probably no more than 25 mL.  The patient will be returned to recovery.  VN/NUANCE  D:10/06/2019 T:10/06/2019 JOB:[id number]

## 2019-10-06 NOTE — Progress Notes (Signed)
I examined this lady 10-05-19 and she reports no change in her health since that time.

## 2019-10-06 NOTE — Transfer of Care (Signed)
Immediate Anesthesia Transfer of Care Note  Patient: [name]  Procedure(s) Performed: CONIZATION CERVIX WITH BIOPSY (N/A Vagina ) DILATATION AND CURETTAGE (N/A Vagina )  Patient Location: PACU  Anesthesia Type:General  Level of Consciousness: awake, alert , oriented and patient cooperative  Airway & Oxygen Therapy: Patient Spontanous Breathing and Patient connected to face mask oxygen  Post-op Assessment: Report given to RN, Post -op Vital signs reviewed and stable and Patient moving all extremities  Post vital signs: Reviewed and stable  Last Vitals:  Vitals Value Taken Time  BP    Temp    Pulse    Resp 17 10/06/19 1029  SpO2    Vitals shown include unvalidated device data.  Last Pain:  Vitals:   10/06/19 0841  TempSrc: Oral  PainSc: 0-No pain      Patients Stated Pain Goal: 5 (59/09/31 1216)  Complications: No complications documented.

## 2019-10-06 NOTE — Op Note (Signed)
Op note:     Pre and post op dx: HGSIL of the cervix    Op: D&C conization of the cervix    General anesthesia     Op: General   EBL 25 cc's.

## 2019-10-06 NOTE — Anesthesia Preprocedure Evaluation (Addendum)
Anesthesia Evaluation  Patient identified by MRN, date of birth, ID band Patient awake    Reviewed: Allergy & Precautions, NPO status , Patient's Chart, lab work & pertinent test results  History of Anesthesia Complications Negative for: history of anesthetic complications  Airway Mallampati: I  TM Distance: >3 FB Neck ROM: Full    Dental  (+) Loose, Dental Advisory Given   Pulmonary Current SmokerPatient did not abstain from smoking.,  10/04/2019 SARS coronavirus NEG   breath sounds clear to auscultation       Cardiovascular negative cardio ROS   Rhythm:Regular Rate:Normal     Neuro/Psych negative neurological ROS     GI/Hepatic negative GI ROS, Neg liver ROS,   Endo/Other  negative endocrine ROS  Renal/GU negative Renal ROS     Musculoskeletal   Abdominal   Peds  Hematology  (+) Sickle cell trait ,   Anesthesia Other Findings   Reproductive/Obstetrics                            Anesthesia Physical Anesthesia Plan  ASA: II  Anesthesia Plan: General   Post-op Pain Management:    Induction: Intravenous  PONV Risk Score and Plan: 2 and Ondansetron and Dexamethasone  Airway Management Planned: LMA  Additional Equipment: None  Intra-op Plan:   Post-operative Plan:   Informed Consent: I have reviewed the patients History and Physical, chart, labs and discussed the procedure including the risks, benefits and alternatives for the proposed anesthesia with the patient or authorized representative who has indicated his/her understanding and acceptance.     Dental advisory given  Plan Discussed with: CRNA and Surgeon  Anesthesia Plan Comments:        Anesthesia Quick Evaluation

## 2019-10-07 ENCOUNTER — Encounter (HOSPITAL_BASED_OUTPATIENT_CLINIC_OR_DEPARTMENT_OTHER): Payer: Self-pay | Admitting: Obstetrics and Gynecology

## 2019-10-07 LAB — SURGICAL PATHOLOGY

## 2021-08-21 LAB — OB RESULTS CONSOLE HIV ANTIBODY (ROUTINE TESTING): HIV: NONREACTIVE

## 2021-08-21 LAB — HEPATITIS C ANTIBODY: HCV Ab: NEGATIVE

## 2021-08-21 LAB — OB RESULTS CONSOLE RUBELLA ANTIBODY, IGM: Rubella: IMMUNE

## 2021-08-21 LAB — OB RESULTS CONSOLE ABO/RH: RH Type: POSITIVE

## 2021-08-21 LAB — OB RESULTS CONSOLE GC/CHLAMYDIA
Chlamydia: NEGATIVE
Neisseria Gonorrhea: NEGATIVE

## 2021-08-21 LAB — OB RESULTS CONSOLE HEPATITIS B SURFACE ANTIGEN: Hepatitis B Surface Ag: NEGATIVE

## 2021-08-21 LAB — OB RESULTS CONSOLE ANTIBODY SCREEN: Antibody Screen: NEGATIVE

## 2021-08-21 LAB — OB RESULTS CONSOLE RPR: RPR: NONREACTIVE

## 2021-12-26 ENCOUNTER — Inpatient Hospital Stay (HOSPITAL_COMMUNITY)
Admission: AD | Admit: 2021-12-26 | Discharge: 2021-12-26 | Disposition: A | Discharge status: Home / Self Care | Payer: BC Managed Care – PPO | Attending: Obstetrics and Gynecology | Admitting: Obstetrics and Gynecology

## 2021-12-26 ENCOUNTER — Encounter: Payer: Self-pay | Admitting: Student

## 2021-12-26 DIAGNOSIS — O9A213 Injury, poisoning and certain other consequences of external causes complicating pregnancy, third trimester: Secondary | ICD-10-CM | POA: Diagnosis not present

## 2021-12-26 DIAGNOSIS — Y939 Activity, unspecified: Secondary | ICD-10-CM | POA: Insufficient documentation

## 2021-12-26 DIAGNOSIS — Z3A27 27 weeks gestation of pregnancy: Secondary | ICD-10-CM | POA: Insufficient documentation

## 2021-12-26 DIAGNOSIS — Z0379 Encounter for other suspected maternal and fetal conditions ruled out: Secondary | ICD-10-CM

## 2021-12-26 DIAGNOSIS — O26892 Other specified pregnancy related conditions, second trimester: Secondary | ICD-10-CM | POA: Insufficient documentation

## 2021-12-26 NOTE — Discharge Instructions (Signed)
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
## 2022-02-05 ENCOUNTER — Ambulatory Visit: Payer: BC Managed Care – PPO | Admitting: Registered"

## 2022-02-28 LAB — OB RESULTS CONSOLE GBS: GBS: NEGATIVE

## 2022-03-11 ENCOUNTER — Telehealth (HOSPITAL_COMMUNITY): Payer: Self-pay | Admitting: *Deleted

## 2022-03-11 NOTE — Telephone Encounter (Signed)
Preadmission screen  

## 2022-03-12 ENCOUNTER — Encounter (HOSPITAL_COMMUNITY): Payer: Self-pay | Admitting: *Deleted

## 2022-03-13 ENCOUNTER — Other Ambulatory Visit: Payer: Self-pay | Admitting: Obstetrics and Gynecology

## 2022-03-13 DIAGNOSIS — O24415 Gestational diabetes mellitus in pregnancy, controlled by oral hypoglycemic drugs: Secondary | ICD-10-CM

## 2022-03-14 ENCOUNTER — Other Ambulatory Visit: Payer: Self-pay

## 2022-03-14 ENCOUNTER — Encounter (HOSPITAL_COMMUNITY): Payer: Self-pay | Admitting: Obstetrics and Gynecology

## 2022-03-14 ENCOUNTER — Inpatient Hospital Stay (HOSPITAL_COMMUNITY): Payer: BC Managed Care – PPO | Admitting: Anesthesiology

## 2022-03-14 ENCOUNTER — Inpatient Hospital Stay (HOSPITAL_COMMUNITY)
Admission: RE | Admit: 2022-03-14 | Discharge: 2022-03-16 | DRG: 788 | Disposition: A | Discharge status: Home / Self Care | Payer: BC Managed Care – PPO | Attending: Obstetrics and Gynecology | Admitting: Obstetrics and Gynecology

## 2022-03-14 ENCOUNTER — Encounter (HOSPITAL_COMMUNITY)
Admission: RE | Disposition: A | Discharge status: Home / Self Care | Payer: Self-pay | Source: Home / Self Care | Attending: Obstetrics and Gynecology

## 2022-03-14 ENCOUNTER — Inpatient Hospital Stay (HOSPITAL_COMMUNITY): Payer: BC Managed Care – PPO

## 2022-03-14 DIAGNOSIS — D259 Leiomyoma of uterus, unspecified: Secondary | ICD-10-CM | POA: Diagnosis present

## 2022-03-14 DIAGNOSIS — O9902 Anemia complicating childbirth: Secondary | ICD-10-CM | POA: Diagnosis present

## 2022-03-14 DIAGNOSIS — O24415 Gestational diabetes mellitus in pregnancy, controlled by oral hypoglycemic drugs: Secondary | ICD-10-CM

## 2022-03-14 DIAGNOSIS — Z87891 Personal history of nicotine dependence: Secondary | ICD-10-CM

## 2022-03-14 DIAGNOSIS — Z3A39 39 weeks gestation of pregnancy: Secondary | ICD-10-CM

## 2022-03-14 DIAGNOSIS — D573 Sickle-cell trait: Secondary | ICD-10-CM | POA: Diagnosis present

## 2022-03-14 DIAGNOSIS — O24420 Gestational diabetes mellitus in childbirth, diet controlled: Secondary | ICD-10-CM | POA: Diagnosis present

## 2022-03-14 DIAGNOSIS — O34211 Maternal care for low transverse scar from previous cesarean delivery: Secondary | ICD-10-CM | POA: Diagnosis present

## 2022-03-14 DIAGNOSIS — O3413 Maternal care for benign tumor of corpus uteri, third trimester: Secondary | ICD-10-CM | POA: Diagnosis present

## 2022-03-14 DIAGNOSIS — Z98891 History of uterine scar from previous surgery: Principal | ICD-10-CM

## 2022-03-14 DIAGNOSIS — O24419 Gestational diabetes mellitus in pregnancy, unspecified control: Secondary | ICD-10-CM | POA: Diagnosis present

## 2022-03-14 LAB — GLUCOSE, CAPILLARY
Glucose-Capillary: 84 mg/dL (ref 70–99)
Glucose-Capillary: 101 mg/dL — ABNORMAL HIGH (ref 70–99)

## 2022-03-14 LAB — CBC
HCT: 30.8 % — ABNORMAL LOW (ref 36.0–46.0)
Hemoglobin: 10.4 g/dL — ABNORMAL LOW (ref 12.0–15.0)
MCH: 27.7 pg (ref 26.0–34.0)
MCHC: 33.8 g/dL (ref 30.0–36.0)
MCV: 81.9 fL (ref 80.0–100.0)
Platelets: 289 10*3/uL (ref 150–400)
RBC: 3.76 MIL/uL — ABNORMAL LOW (ref 3.87–5.11)
RDW: 16.7 % — ABNORMAL HIGH (ref 11.5–15.5)
WBC: 8.6 10*3/uL (ref 4.0–10.5)
nRBC: 0.2 % (ref 0.0–0.2)

## 2022-03-14 LAB — TYPE AND SCREEN
ABO/RH(D): O POS
Antibody Screen: NEGATIVE

## 2022-03-14 LAB — RPR: RPR Ser Ql: NONREACTIVE

## 2022-03-14 SURGERY — Surgical Case
Anesthesia: Spinal | Site: Abdomen

## 2022-03-14 MED ORDER — LACTATED RINGERS IV SOLN: 500.0000 mL | INTRAVENOUS | Status: DC | PRN

## 2022-03-14 MED ORDER — ACETAMINOPHEN 10 MG/ML IV SOLN
1000.0000 mg | Freq: Once | INTRAVENOUS | Status: DC | PRN
  Administered 2022-03-14: 1000 mg via INTRAVENOUS

## 2022-03-14 MED ORDER — SOD CITRATE-CITRIC ACID 500-334 MG/5ML PO SOLN: 30.0000 mL | ORAL | Status: DC

## 2022-03-14 MED ORDER — OXYTOCIN BOLUS FROM INFUSION: 333.0000 mL | Freq: Once | INTRAVENOUS | Status: DC

## 2022-03-14 MED ORDER — OXYCODONE HCL 5 MG PO TABS
5.0000 mg | ORAL_TABLET | ORAL | Status: DC | PRN
  Administered 2022-03-15: 5 mg via ORAL
  Administered 2022-03-15 – 2022-03-16 (×6): 10 mg via ORAL
  Filled 2022-03-14: qty 2
  Filled 2022-03-14: qty 1
  Filled 2022-03-14 (×5): qty 2

## 2022-03-14 MED ORDER — ACETAMINOPHEN 160 MG/5ML PO SOLN: 1000.0000 mg | Freq: Once | ORAL | Status: DC | PRN

## 2022-03-14 MED ORDER — ONDANSETRON HCL 4 MG/2ML IJ SOLN
INTRAMUSCULAR | Status: AC
  Filled 2022-03-14: qty 2

## 2022-03-14 MED ORDER — FENTANYL CITRATE (PF) 100 MCG/2ML IJ SOLN
INTRAMUSCULAR | Status: DC | PRN
  Administered 2022-03-14: 15 ug via INTRATHECAL

## 2022-03-14 MED ORDER — TETANUS-DIPHTH-ACELL PERTUSSIS 5-2.5-18.5 LF-MCG/0.5 IM SUSY: 0.5000 mL | PREFILLED_SYRINGE | Freq: Once | INTRAMUSCULAR | Status: DC

## 2022-03-14 MED ORDER — OXYTOCIN-SODIUM CHLORIDE 30-0.9 UT/500ML-% IV SOLN
INTRAVENOUS | Status: AC
  Filled 2022-03-14: qty 500

## 2022-03-14 MED ORDER — SENNOSIDES-DOCUSATE SODIUM 8.6-50 MG PO TABS
2.0000 | ORAL_TABLET | ORAL | Status: DC
  Administered 2022-03-15: 2 via ORAL
  Filled 2022-03-14: qty 2

## 2022-03-14 MED ORDER — LIDOCAINE HCL (PF) 1 % IJ SOLN: 30.0000 mL | INTRAMUSCULAR | Status: DC | PRN

## 2022-03-14 MED ORDER — FENTANYL CITRATE (PF) 100 MCG/2ML IJ SOLN
INTRAMUSCULAR | Status: AC
  Filled 2022-03-14: qty 2

## 2022-03-14 MED ORDER — PHENYLEPHRINE HCL-NACL 20-0.9 MG/250ML-% IV SOLN
INTRAVENOUS | Status: AC
  Filled 2022-03-14: qty 250

## 2022-03-14 MED ORDER — OXYCODONE-ACETAMINOPHEN 5-325 MG PO TABS: 1.0000 | ORAL_TABLET | ORAL | Status: DC | PRN

## 2022-03-14 MED ORDER — TERBUTALINE SULFATE 1 MG/ML IJ SOLN: 0.2500 mg | Freq: Once | INTRAMUSCULAR | Status: DC | PRN

## 2022-03-14 MED ORDER — LACTATED RINGERS IV SOLN: INTRAVENOUS | Status: DC

## 2022-03-14 MED ORDER — SIMETHICONE 80 MG PO CHEW: 80.0000 mg | CHEWABLE_TABLET | ORAL | Status: DC | PRN

## 2022-03-14 MED ORDER — OXYTOCIN-SODIUM CHLORIDE 30-0.9 UT/500ML-% IV SOLN: 2.5000 [IU]/h | INTRAVENOUS | Status: DC

## 2022-03-14 MED ORDER — SOD CITRATE-CITRIC ACID 500-334 MG/5ML PO SOLN
30.0000 mL | ORAL | Status: DC | PRN
  Filled 2022-03-14: qty 30

## 2022-03-14 MED ORDER — OXYTOCIN-SODIUM CHLORIDE 30-0.9 UT/500ML-% IV SOLN
INTRAVENOUS | Status: DC | PRN
  Administered 2022-03-14: 30 [IU] via INTRAVENOUS

## 2022-03-14 MED ORDER — COCONUT OIL OIL
1.0000 | TOPICAL_OIL | Status: DC | PRN
  Administered 2022-03-16: 1 via TOPICAL

## 2022-03-14 MED ORDER — BUPIVACAINE IN DEXTROSE 0.75-8.25 % IT SOLN
INTRATHECAL | Status: DC | PRN
  Administered 2022-03-14: 1.6 mL via INTRATHECAL

## 2022-03-14 MED ORDER — PHENYLEPHRINE 80 MCG/ML (10ML) SYRINGE FOR IV PUSH (FOR BLOOD PRESSURE SUPPORT)
PREFILLED_SYRINGE | INTRAVENOUS | Status: DC | PRN
  Administered 2022-03-14: 80 ug via INTRAVENOUS

## 2022-03-14 MED ORDER — MENTHOL 3 MG MT LOZG: 1.0000 | LOZENGE | OROMUCOSAL | Status: DC | PRN

## 2022-03-14 MED ORDER — SODIUM CHLORIDE 0.9 % IR SOLN
Status: DC | PRN
  Administered 2022-03-14: 1000 mL

## 2022-03-14 MED ORDER — PRENATAL MULTIVITAMIN CH
1.0000 | ORAL_TABLET | Freq: Every day | ORAL | Status: DC
  Administered 2022-03-15 – 2022-03-16 (×2): 1 via ORAL
  Filled 2022-03-14 (×2): qty 1

## 2022-03-14 MED ORDER — ACETAMINOPHEN 325 MG PO TABS: 650.0000 mg | ORAL_TABLET | ORAL | Status: DC | PRN

## 2022-03-14 MED ORDER — IBUPROFEN 600 MG PO TABS
600.0000 mg | ORAL_TABLET | Freq: Four times a day (QID) | ORAL | Status: DC
  Administered 2022-03-15 – 2022-03-16 (×4): 600 mg via ORAL
  Filled 2022-03-14 (×4): qty 1

## 2022-03-14 MED ORDER — ACETAMINOPHEN 500 MG PO TABS: 1000.0000 mg | ORAL_TABLET | Freq: Once | ORAL | Status: DC | PRN

## 2022-03-14 MED ORDER — OXYCODONE-ACETAMINOPHEN 5-325 MG PO TABS: 2.0000 | ORAL_TABLET | ORAL | Status: DC | PRN

## 2022-03-14 MED ORDER — METHYLERGONOVINE MALEATE 0.2 MG/ML IJ SOLN: 0.2000 mg | INTRAMUSCULAR | Status: DC | PRN

## 2022-03-14 MED ORDER — MORPHINE SULFATE (PF) 0.5 MG/ML IJ SOLN
INTRAMUSCULAR | Status: DC | PRN
  Administered 2022-03-14: 150 ug via INTRATHECAL

## 2022-03-14 MED ORDER — PHENYLEPHRINE HCL-NACL 20-0.9 MG/250ML-% IV SOLN
INTRAVENOUS | Status: DC | PRN
  Administered 2022-03-14: 80 ug/min via INTRAVENOUS

## 2022-03-14 MED ORDER — ONDANSETRON HCL 4 MG/2ML IJ SOLN: 4.0000 mg | Freq: Four times a day (QID) | INTRAMUSCULAR | Status: DC | PRN

## 2022-03-14 MED ORDER — IBUPROFEN 600 MG PO TABS: 600.0000 mg | ORAL_TABLET | Freq: Four times a day (QID) | ORAL | Status: DC

## 2022-03-14 MED ORDER — ACETAMINOPHEN 10 MG/ML IV SOLN
INTRAVENOUS | Status: AC
  Filled 2022-03-14: qty 100

## 2022-03-14 MED ORDER — DIPHENHYDRAMINE HCL 25 MG PO CAPS: 25.0000 mg | ORAL_CAPSULE | Freq: Four times a day (QID) | ORAL | Status: DC | PRN

## 2022-03-14 MED ORDER — DEXAMETHASONE SODIUM PHOSPHATE 10 MG/ML IJ SOLN
INTRAMUSCULAR | Status: AC
  Filled 2022-03-14: qty 1

## 2022-03-14 MED ORDER — ZOLPIDEM TARTRATE 5 MG PO TABS: 5.0000 mg | ORAL_TABLET | Freq: Every evening | ORAL | Status: DC | PRN

## 2022-03-14 MED ORDER — KETOROLAC TROMETHAMINE 30 MG/ML IJ SOLN
30.0000 mg | Freq: Four times a day (QID) | INTRAMUSCULAR | Status: AC
  Administered 2022-03-14 – 2022-03-15 (×2): 30 mg via INTRAVENOUS
  Filled 2022-03-14 (×3): qty 1

## 2022-03-14 MED ORDER — MAGNESIUM HYDROXIDE 400 MG/5ML PO SUSP: 30.0000 mL | ORAL | Status: DC | PRN

## 2022-03-14 MED ORDER — STERILE WATER FOR IRRIGATION IR SOLN
Status: DC | PRN
  Administered 2022-03-14: 1000 mL

## 2022-03-14 MED ORDER — ONDANSETRON HCL 4 MG/2ML IJ SOLN
INTRAMUSCULAR | Status: DC | PRN
  Administered 2022-03-14: 4 mg via INTRAVENOUS

## 2022-03-14 MED ORDER — MORPHINE SULFATE (PF) 0.5 MG/ML IJ SOLN
INTRAMUSCULAR | Status: AC
  Filled 2022-03-14: qty 10

## 2022-03-14 MED ORDER — WITCH HAZEL-GLYCERIN EX PADS: 1.0000 | MEDICATED_PAD | CUTANEOUS | Status: DC | PRN

## 2022-03-14 MED ORDER — MEASLES, MUMPS & RUBELLA VAC IJ SOLR: 0.5000 mL | Freq: Once | INTRAMUSCULAR | Status: DC

## 2022-03-14 MED ORDER — ACETAMINOPHEN 500 MG PO TABS
1000.0000 mg | ORAL_TABLET | Freq: Four times a day (QID) | ORAL | Status: DC
  Administered 2022-03-14 – 2022-03-16 (×7): 1000 mg via ORAL
  Filled 2022-03-14 (×8): qty 2

## 2022-03-14 MED ORDER — OXYTOCIN-SODIUM CHLORIDE 30-0.9 UT/500ML-% IV SOLN: 2.5000 [IU]/h | INTRAVENOUS | Status: AC

## 2022-03-14 MED ORDER — DEXAMETHASONE SODIUM PHOSPHATE 10 MG/ML IJ SOLN
INTRAMUSCULAR | Status: DC | PRN
  Administered 2022-03-14: 10 mg via INTRAVENOUS

## 2022-03-14 MED ORDER — KETOROLAC TROMETHAMINE 30 MG/ML IJ SOLN
30.0000 mg | Freq: Four times a day (QID) | INTRAMUSCULAR | Status: DC
  Administered 2022-03-14: 30 mg via INTRAVENOUS
  Filled 2022-03-14: qty 1

## 2022-03-14 MED ORDER — OXYTOCIN-SODIUM CHLORIDE 30-0.9 UT/500ML-% IV SOLN
1.0000 m[IU]/min | INTRAVENOUS | Status: DC
  Administered 2022-03-14: 1 m[IU]/min via INTRAVENOUS
  Filled 2022-03-14: qty 500

## 2022-03-14 MED ORDER — DIBUCAINE (PERIANAL) 1 % EX OINT: 1.0000 | TOPICAL_OINTMENT | CUTANEOUS | Status: DC | PRN

## 2022-03-14 MED ORDER — METHYLERGONOVINE MALEATE 0.2 MG PO TABS: 0.2000 mg | ORAL_TABLET | ORAL | Status: DC | PRN

## 2022-03-14 MED ORDER — PHENYLEPHRINE 80 MCG/ML (10ML) SYRINGE FOR IV PUSH (FOR BLOOD PRESSURE SUPPORT)
PREFILLED_SYRINGE | INTRAVENOUS | Status: AC
  Filled 2022-03-14: qty 10

## 2022-03-14 MED ORDER — FENTANYL CITRATE (PF) 100 MCG/2ML IJ SOLN: 25.0000 ug | INTRAMUSCULAR | Status: DC | PRN

## 2022-03-14 MED ORDER — CEFAZOLIN SODIUM-DEXTROSE 2-4 GM/100ML-% IV SOLN
INTRAVENOUS | Status: AC
  Filled 2022-03-14: qty 100

## 2022-03-14 MED ORDER — CEFAZOLIN SODIUM-DEXTROSE 2-4 GM/100ML-% IV SOLN
2.0000 g | INTRAVENOUS | Status: AC
  Administered 2022-03-14: 2 g via INTRAVENOUS

## 2022-03-14 SURGICAL SUPPLY — 30 items
BENZOIN TINCTURE PRP APPL 2/3 (GAUZE/BANDAGES/DRESSINGS) IMPLANT
CHLORAPREP W/TINT 26 (MISCELLANEOUS) ×2 IMPLANT
CLAMP UMBILICAL CORD (MISCELLANEOUS) ×1 IMPLANT
CLOTH BEACON ORANGE TIMEOUT ST (SAFETY) ×1 IMPLANT
DRSG OPSITE POSTOP 4X10 (GAUZE/BANDAGES/DRESSINGS) ×1 IMPLANT
ELECT REM PT RETURN 9FT ADLT (ELECTROSURGICAL) ×1
ELECTRODE REM PT RTRN 9FT ADLT (ELECTROSURGICAL) ×1 IMPLANT
EXTRACTOR VACUUM KIWI (MISCELLANEOUS) IMPLANT
EXTRACTOR VACUUM M CUP 4 TUBE (SUCTIONS) IMPLANT
GLOVE BIOGEL PI IND STRL 7.0 (GLOVE) ×1 IMPLANT
GLOVE ORTHO TXT STRL SZ7.5 (GLOVE) ×1 IMPLANT
GOWN STRL REUS W/TWL LRG LVL3 (GOWN DISPOSABLE) ×2 IMPLANT
KIT ABG SYR 3ML LUER SLIP (SYRINGE) IMPLANT
NDL HYPO 25X5/8 SAFETYGLIDE (NEEDLE) ×1 IMPLANT
NEEDLE HYPO 25X5/8 SAFETYGLIDE (NEEDLE) ×1 IMPLANT
NS IRRIG 1000ML POUR BTL (IV SOLUTION) ×1 IMPLANT
PACK C SECTION WH (CUSTOM PROCEDURE TRAY) ×1 IMPLANT
PAD OB MATERNITY 4.3X12.25 (PERSONAL CARE ITEMS) ×1 IMPLANT
RTRCTR C-SECT PINK 25CM LRG (MISCELLANEOUS) ×1 IMPLANT
STRIP CLOSURE SKIN 1/2X4 (GAUZE/BANDAGES/DRESSINGS) IMPLANT
SUT CHROMIC 1 CTX 36 (SUTURE) ×2 IMPLANT
SUT PLAIN 0 NONE (SUTURE) IMPLANT
SUT PLAIN 2 0 XLH (SUTURE) IMPLANT
SUT VIC AB 0 CT1 27 (SUTURE) ×2
SUT VIC AB 0 CT1 27XBRD ANBCTR (SUTURE) ×2 IMPLANT
SUT VIC AB 2-0 CT1 (SUTURE) ×1 IMPLANT
SUT VIC AB 4-0 KS 27 (SUTURE) IMPLANT
TOWEL OR 17X24 6PK STRL BLUE (TOWEL DISPOSABLE) ×1 IMPLANT
TRAY FOLEY W/BAG SLVR 14FR LF (SET/KITS/TRAYS/PACK) ×1 IMPLANT
WATER STERILE IRR 1000ML POUR (IV SOLUTION) ×1 IMPLANT

## 2022-03-14 NOTE — Transfer of Care (Signed)
Immediate Anesthesia Transfer of Care Note  Patient: [name]  Procedure(s) Performed: CESAREAN SECTION (Abdomen)  Patient Location: PACU  Anesthesia Type:Spinal  Level of Consciousness: awake, alert , and oriented  Airway & Oxygen Therapy: Patient Spontanous Breathing  Post-op Assessment: Report given to RN and Post -op Vital signs reviewed and stable  Post vital signs: Reviewed and stable  Last Vitals:  Vitals Value Taken Time  BP 103/62 03/14/22 1256  Temp 36.9 C 03/14/22 1256  Pulse 82 03/14/22 1256  Resp 18 03/14/22 1256  SpO2 98 % 03/14/22 1256    Last Pain:  Vitals:   03/14/22 1256  TempSrc: Oral  PainSc:          Complications: No notable events documented.

## 2022-03-14 NOTE — Anesthesia Procedure Notes (Signed)
Spinal  Patient location during procedure: OR Start time: 03/14/2022 10:37 AM End time: 03/14/2022 10:40 AM Reason for block: surgical anesthesia Staffing Performed: anesthesiologist  Anesthesiologist: Oleta Mouse, MD Performed by: Oleta Mouse, MD Authorized by: Oleta Mouse, MD   Preanesthetic Checklist Completed: patient identified, IV checked, risks and benefits discussed, surgical consent, monitors and equipment checked, pre-op evaluation and timeout performed Spinal Block Patient position: sitting Prep: DuraPrep Patient monitoring: heart rate, cardiac monitor, continuous pulse ox and blood pressure Approach: midline Location: L3-4 Injection technique: single-shot Needle Needle type: Pencan  Needle gauge: 24 G Needle length: 9 cm Assessment Sensory level: T6 Events: CSF return

## 2022-03-14 NOTE — Lactation Note (Signed)
This note was copied from a baby's chart. Lactation Consultation Note  Patient Name: [name] [id number] Date: 03/14/2022 Reason for consult: Initial assessment;Term Age:36 hours Mom holding baby STS, stated baby has been sleepy not interested in BF. Baby did latch well earlier and went well. Baby has been spitting up per mom. LC cleared baby of mucus w/bulb syringe once while in consult. Attempted to latch but baby isn't interested. Since mom had GDM LC suggested hand expression and spoon feeding colostrum. Mom in agreement. LC collected 3 ml colostrum and baby took it w/much stimulation cheek massage to swallow.  Newborn feeding habits, STS, importance of I&O, support during feedings, positioning and effects to mom from BF. Mom encouraged to feed baby 8-12 times/24 hours and with feeding cues.  Encouraged mom to call for assistance when needed. Reviewed s/sx of hypoglycemia as well as elevated bilirubin. Notify RN if notices any symptoms. Maternal Data Has patient been taught Hand Expression?: Yes Does the patient have breastfeeding experience prior to this delivery?: Yes How long did the patient breastfeed?: 3 months to her now 36 yr old  Feeding    LATCH Score Latch: Too sleepy or reluctant, no latch achieved, no sucking elicited.  Audible Swallowing: None  Type of Nipple: Everted at rest and after stimulation  Comfort (Breast/Nipple): Soft / non-tender  Hold (Positioning): Assistance needed to correctly position infant at breast and maintain latch.  LATCH Score: 5   Lactation Tools Discussed/Used    Interventions Interventions: Breast feeding basics reviewed;Adjust position;Assisted with latch;Support pillows;Skin to skin;Position options;Breast massage;Expressed milk;Hand express;LC Services brochure;Breast compression  Discharge    Consult Status Consult Status: Follow-up Date: 03/15/21 Follow-up type: In-patient    Theodoro Kalata 03/14/2022, 7:59  PM

## 2022-03-14 NOTE — Anesthesia Preprocedure Evaluation (Addendum)
Anesthesia Evaluation  Patient identified by MRN, date of birth, ID band Patient awake    Reviewed: Allergy & Precautions, NPO status , Patient's Chart, lab work & pertinent test results  History of Anesthesia Complications Negative for: history of anesthetic complications  Airway Mallampati: I  TM Distance: >3 FB Neck ROM: Full    Dental  (+) Dental Advisory Given, Teeth Intact   Pulmonary neg shortness of breath, neg sleep apnea, neg COPD, neg recent URI, former smoker   breath sounds clear to auscultation       Cardiovascular negative cardio ROS  Rhythm:Regular     Neuro/Psych negative neurological ROS  negative psych ROS   GI/Hepatic negative GI ROS, Neg liver ROS,,,  Endo/Other  diabetes, Gestational    Renal/GU negative Renal ROS     Musculoskeletal negative musculoskeletal ROS (+)    Abdominal   Peds  Hematology negative hematology ROS (+) Lab Results      Component                Value               Date                      WBC                      8.6                 03/14/2022                HGB                      10.4 (L)            03/14/2022                HCT                      30.8 (L)            03/14/2022                MCV                      81.9                03/14/2022                PLT                      289                 03/14/2022              Anesthesia Other Findings   Reproductive/Obstetrics (+) Pregnancy                             Anesthesia Physical Anesthesia Plan  ASA: 2  Anesthesia Plan: Spinal   Post-op Pain Management:    Induction:   PONV Risk Score and Plan: 2 and Ondansetron and Treatment may vary due to age or medical condition  Airway Management Planned: Nasal Cannula and Natural Airway  Additional Equipment: None  Intra-op Plan:   Post-operative Plan:   Informed Consent: I have reviewed the patients History and  Physical, chart, labs and discussed the procedure including the risks, benefits  and alternatives for the proposed anesthesia with the patient or authorized representative who has indicated his/her understanding and acceptance.     Dental advisory given  Plan Discussed with: CRNA  Anesthesia Plan Comments:        Anesthesia Quick Evaluation

## 2022-03-14 NOTE — Anesthesia Postprocedure Evaluation (Signed)
Anesthesia Post Note  Patient: [name]  Procedure(s) Performed: CESAREAN SECTION (Abdomen)     Patient location during evaluation: PACU Anesthesia Type: Spinal Level of consciousness: awake and alert Pain management: pain level controlled Vital Signs Assessment: post-procedure vital signs reviewed and stable Respiratory status: spontaneous breathing, nonlabored ventilation and respiratory function stable Cardiovascular status: stable and blood pressure returned to baseline Postop Assessment: no apparent nausea or vomiting Anesthetic complications: no   No notable events documented.  Last Vitals:  Vitals:   03/14/22 1256 03/14/22 1415  BP: 103/62 120/67  Pulse: 82 85  Resp: 18 16  Temp: 36.9 C 36.7 C  SpO2: 98% 98%    Last Pain:  Vitals:   03/14/22 1430  TempSrc:   PainSc: 2                  Verna Hamon

## 2022-03-14 NOTE — Op Note (Signed)
Preoperative diagnosis: Intrauterine pregnancy at 39 weeks, previous c-section-declines TOL, gestational diabetes Postoperative diagnosis: Same Procedure: Repeat low transverse cesarean section without extensions Surgeon: Cheri Fowler M.D. Anesthesia: Spinal  Findings: Patient had normal gravid anatomy and delivered a viable female infant with Apgars of 9 and 9 weight pending.  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 The anesthesiologist instilled spinal anesthesia.  She was then placed in the dorsosupine position with left tilt. Abdomen was then prepped and draped in the usual sterile fashion, and a foley catheter was inserted. The level of her anesthesia was found to be adequate. Abdomen was entered via a standard Pfannenstiel incision through her previous scar. Once the peritoneal cavity was entered the Alexis disposable self-retaining retractor was placed and good visualization was achieved. A 4 cm transverse incision was then made in the lower uterine segment pushing the bladder inferior. Once the uterine cavity was entered the incision was extended digitally, clear amniotic fluid. The fetal vertex was grasped and delivered through the incision atraumatically. Mouth and nares were suctioned. The remainder of the infant then delivered atraumatically. Cord was doubly clamped and cut after one minute and the infant handed to the awaiting pediatric team. Cord blood was obtained. The placenta delivered spontaneously. Uterus was wiped dry with clean lap pad and all clots and debris were removed. Uterine incision was inspected and found to be free of extensions. Uterine incision was closed in 1 layer with running locking #1 Chromic. Tubes and ovaries were inspected and found to be normal. 5-6 cm myoma in  right broad ligament.  Uterine incision was inspected and found to be hemostatic. Bleeding from serosal edges was controlled with electrocautery. The Alexis retractor was removed. Subfascial space was irrigated and made hemostatic with electrocautery.  Fascia was closed in running fashion starting at both ends and meeting in the middle with 0 Vicryl. Subcutaneous tissue was then irrigated and made hemostatic with electrocautery. Skin was closed with running 4-0 Vicryl subcuticular suture followed by steri-strips and a sterile dressing. Patient tolerated the procedure well and was taken to the recovery in stable condition. Counts were correct x2, she received Ancef 2 g IV at the beginning of the procedure and she had PAS hose on throughout the procedure.

## 2022-03-14 NOTE — H&P (Signed)
Adaijah Endres is a 37 y.o. female, G3 P1011, EGA [redacted] weeks with EDC 2-2 presenting for labor induction for GDM.  PNC complicated by previous LTCS for possible abruption, decided to attempt VBAC, consent signed.  GDM, suboptimal compliance with checking sugars and taking Metformin, but adequate control with what she has reported.  She is also AMA, on baby ASA, and has fibroids.  OB History     Gravida  3   Para  1   Term  1   Preterm      AB  1   Living  1      SAB      IAB  1   Ectopic      Multiple      Live Births  1          Past Medical History:  Diagnosis Date   HGSIL (high grade squamous intraepithelial lesion) on Pap smear of cervix 08/2019   History of trichomonal vaginitis    Sickle cell trait (HCC)    Wears glasses    Past Surgical History:  Procedure Laterality Date   CERVICAL CONIZATION W/BX N/A 10/06/2019   Procedure: CONIZATION CERVIX WITH BIOPSY;  Surgeon: Newton Pigg, MD;  Location: [place];  Service: Gynecology;  Laterality: N/A;   CESAREAN SECTION N/A 09/08/2013   Procedure: CESAREAN SECTION;  Surgeon: Janyth Contes, MD;  Location: [facility];  Service: Obstetrics;  Laterality: N/A;   DILATION AND CURETTAGE OF UTERUS N/A 10/06/2019   Procedure: DILATATION AND CURETTAGE;  Surgeon: Newton Pigg, MD;  Location: [place];  Service: Gynecology;  Laterality: N/A;   Family History: family history is not on file. Social History:  reports that she has quit smoking. Her smoking use included cigars. She has never used smokeless tobacco. She reports that she does not currently use alcohol. She reports that she does not use drugs.     Maternal Diabetes: Yes:  Diabetes Type:  Diet controlled Genetic Screening: Normal Maternal Ultrasounds/Referrals: Normal Fetal Ultrasounds or other Referrals:  None Maternal Substance Abuse:  No Significant Maternal Medications:  None Significant Maternal Lab Results:  Group B Strep  negative Number of Prenatal Visits:greater than 3 verified prenatal visits Other Comments:  None  Review of Systems  Respiratory: Negative.    Cardiovascular: Negative.    Maternal Medical History:  Fetal activity: Perceived fetal activity is normal.   Prenatal Complications - Diabetes: gestational. Diabetes is managed by diet.     Exam by:: Krystal Eaton RN Blood pressure 121/74, pulse 90, temperature 98 F (36.7 C), temperature source Oral, resp. rate 18, height '5\' 4"'$  (1.626 m), weight 86.1 kg, last menstrual period 06/14/2021. Maternal Exam:  Uterine Assessment: Contraction strength is mild.  Contraction frequency is rare.  Abdomen: Patient reports no abdominal tenderness. Surgical scars: low transverse.   Estimated fetal weight is 8 lbs.   Fetal presentation: vertex Introitus: Normal vulva. Normal vagina.  Amniotic fluid character: not assessed. Pelvis: adequate for delivery.     Fetal Exam Fetal Monitor Review: Mode: ultrasound.   Baseline rate: 130.  Variability: moderate (6-25 bpm).   Pattern: accelerations present and no decelerations.   Fetal State Assessment: Category I - tracings are normal.   Physical Exam Vitals reviewed.  Constitutional:      Appearance: Normal appearance.  Cardiovascular:     Rate and Rhythm: Normal rate and regular rhythm.  Pulmonary:     Effort: Pulmonary effort is normal. No respiratory distress.  Breath sounds: No wheezing.  Abdominal:     Palpations: Abdomen is soft.  Genitourinary:    General: Normal vulva.  Neurological:     Mental Status: She is alert.     Prenatal labs: ABO, Rh: --/--/O POS (01/26 3817) Antibody: NEG (01/26 0640) Rubella: Immune (07/05 0000) RPR: Nonreactive (07/05 0000)  HBsAg: Negative (07/05 0000)  HIV: Non-reactive (07/05 0000)  GBS: Negative/-- (01/12 0000)   Assessment/Plan: IUP at 39 weeks, GDM, previous LTCS, admitted for induction.  Pitocin had been started and up to 2 mu/min, but patient  has now decided she does not want VBAC, wants repeat c-section.  C-section procedure and risks discussed.  OR notified, scheduled c-section for 1030, pitocin turned off.   Blane Ohara Javani Spratt 03/14/2022, 9:52 AM

## 2022-03-15 LAB — CBC
HCT: 25.9 % — ABNORMAL LOW (ref 36.0–46.0)
Hemoglobin: 8.8 g/dL — ABNORMAL LOW (ref 12.0–15.0)
MCH: 27.7 pg (ref 26.0–34.0)
MCHC: 34 g/dL (ref 30.0–36.0)
MCV: 81.4 fL (ref 80.0–100.0)
Platelets: 252 10*3/uL (ref 150–400)
RBC: 3.18 MIL/uL — ABNORMAL LOW (ref 3.87–5.11)
RDW: 16.4 % — ABNORMAL HIGH (ref 11.5–15.5)
WBC: 13.5 10*3/uL — ABNORMAL HIGH (ref 4.0–10.5)
nRBC: 0 % (ref 0.0–0.2)

## 2022-03-15 NOTE — Lactation Note (Signed)
This note was copied from a baby's chart. Lactation Consultation Note  Patient Name: [name] [id number] Date: 03/15/2022 Reason for consult: Follow-up assessment (infant with weight loss of -4.99%) Age:36 hours LC entered the room infant was asleep, Per Birth Parent infant recently BF for 20 minutes. Infant had 4 stools but not void diaper today. Birth Parent concern LC suggested she hand express and give infant back EBM after latching infant at the breast. Birth Parent expressed 2 mls of colostrum that was spoon fed to infant, LC gave Birth Parent hand pump but she did not like it she prefers to hand express, Birth Parent was still expressing colostrum in bottle that she will offer to infant at next feeding after latching infant at the breast. LC suggested Birth Parent BF infant STS and not swaddle in blankets and clothing for active feeding due Birth Parent mentioning infant has tendency to fall aslseep at the breast. Birth Parent knows she can call [facility] for next latch if needed.  Maternal Data    Feeding Mother's Current Feeding Choice: Breast Milk  LATCH Score                    Lactation Tools Discussed/Used Tools: Pump Breast pump type: Manual Pump Education: Setup, frequency, and cleaning;Milk Storage Reason for Pumping: prn Pumping frequency: prn  Interventions Interventions: Skin to skin;Hand express;Education;Hand pump  Discharge    Consult Status Consult Status: Follow-up Date: 03/16/22 Follow-up type: In-patient    Eulis Canner 03/15/2022, 4:28 PM

## 2022-03-15 NOTE — Progress Notes (Signed)
Patient is doing well.  She is tolerating PO, ambulating, voiding.  Pain is controlled.  Lochia is appropriate  Vitals:   03/15/22 0006 03/15/22 0101 03/15/22 0151 03/15/22 0619  BP:   (!) 94/50 (!) 111/58  Pulse:   69 67  Resp:   18 18  Temp:   97.9 F (36.6 C) 97.6 F (36.4 C)  TempSrc:   Oral Oral  SpO2: 97% 97% 97% 97%  Weight:      Height:        NAD Abdomen:  soft, appropriate tenderness, incisions intact and without erythema or drainage ext:    Symmetric, trace pedal edema bilaterally  Lab Results  Component Value Date   WBC 13.5 (H) 03/15/2022   HGB 8.8 (L) 03/15/2022   HCT 25.9 (L) 03/15/2022   MCV 81.4 03/15/2022   PLT 252 03/15/2022    --/--/O POS (01/26 0640)  A/P    37 y.o. [id number] POD #1 s/p repeat cesarean section Routine post op and postpartum care.   Anticipate discharge tomorrow

## 2022-03-16 MED ORDER — IBUPROFEN 600 MG PO TABS: 600.0000 mg | ORAL_TABLET | Freq: Four times a day (QID) | ORAL | 0 refills | Status: AC

## 2022-03-16 MED ORDER — OXYCODONE HCL 5 MG PO TABS: 5.0000 mg | ORAL_TABLET | Freq: Four times a day (QID) | ORAL | 0 refills | Status: AC | PRN

## 2022-03-16 NOTE — Progress Notes (Signed)
Patient is doing well.  She is tolerating PO, ambulating, voiding.  Pain is controlled.  Lochia is appropriate  Vitals:   03/15/22 0619 03/15/22 1454 03/15/22 2201 03/16/22 0500  BP: (!) 111/58 113/70 108/69 107/66  Pulse: 67 75 81 68  Resp: '18 16 18 16  '$ Temp: 97.6 F (36.4 C) 97.9 F (36.6 C) 98 F (36.7 C) 98 F (36.7 C)  TempSrc: Oral Oral Axillary Oral  SpO2: 97% 100%  100%  Weight:      Height:        NAD Abdomen:  soft, appropriate tenderness, incisions intact and without erythema or drainage ext:    Symmetric, trace pedal edema bilaterally  Lab Results  Component Value Date   WBC 13.5 (H) 03/15/2022   HGB 8.8 (L) 03/15/2022   HCT 25.9 (L) 03/15/2022   MCV 81.4 03/15/2022   PLT 252 03/15/2022    --/--/O POS (01/26 0640)  A/P    37 y.o. [id number] POD #2 s/p repeat cesarean section Routine post op and postpartum care.   Meeting all goals.  Discharge to home today.

## 2022-03-16 NOTE — Discharge Summary (Signed)
Postpartum Discharge Summary  Date of Service updated 03/16/22     Patient Name: [name] DOB: [birth year] MRN: [id number]  Date of admission: 03/14/2022 Delivery date:03/14/2022  Delivering provider: Willis Modena, TODD  Date of discharge: 03/16/2022  Admitting diagnosis: Gestational diabetes mellitus (GDM) in third trimester [O24.419] Intrauterine pregnancy: [redacted]w[redacted]d    Secondary diagnosis:  Principal Problem:   Gestational diabetes mellitus (GDM) in third trimester  Additional problems: History of cesarean section    Discharge diagnosis: Term Pregnancy Delivered                                              Post partum procedures: n/a Augmentation: Pitocin Complications: None  Hospital course: Induction of Labor With Cesarean Section   37y.o. yo [id number] admitted to the hospital 03/14/2022 for induction of labor for GDMA2.  She had a history of prior cesarean delivery x 1 and desired TOLAC.  She received pitocin for a brief period of time then decided that she wanted to proceed with RCS.   Delivery details are as follows: Membrane Rupture Time/Date: 11:03 AM ,03/14/2022   Delivery Method:C-Section, Low Transverse  Details of operation can be found in separate operative Note.  Patient had a postpartum course complicated by n/a. She is ambulating, tolerating a regular diet, passing flatus, and urinating well.  Patient is discharged home in stable condition on 03/16/22.      Newborn Data: Birth date:03/14/2022  Birth time:11:04 AM  Gender:Female  Living status:Living  Apgars:9 ,9  Weight:3147 g                                 Physical exam  Vitals:   03/15/22 0619 03/15/22 1454 03/15/22 2201 03/16/22 0500  BP: (!) 111/58 113/70 108/69 107/66  Pulse: 67 75 81 68  Resp: '18 16 18 16  '$ Temp: 97.6 F (36.4 C) 97.9 F (36.6 C) 98 F (36.7 C) 98 F (36.7 C)  TempSrc: Oral Oral Axillary Oral  SpO2: 97% 100%  100%  Weight:      Height:       General: alert,  cooperative, and no distress Lochia: appropriate Uterine Fundus: firm Incision: Dressing is clean, dry, and intact DVT Evaluation: No evidence of DVT seen on physical exam. Labs: Lab Results  Component Value Date   WBC 13.5 (H) 03/15/2022   HGB 8.8 (L) 03/15/2022   HCT 25.9 (L) 03/15/2022   MCV 81.4 03/15/2022   PLT 252 03/15/2022      Latest Ref Rng & Units 10/06/2019    8:53 AM  CMP  Glucose 70 - 99 mg/dL 97   BUN 6 - 20 mg/dL 16   Creatinine 0.44 - 1.00 mg/dL 0.80   Sodium 135 - 145 mmol/L 138   Potassium 3.5 - 5.1 mmol/L 3.9   Chloride 98 - 111 mmol/L 105   CO2 22 - 32 mmol/L 26   Calcium 8.9 - 10.3 mg/dL 9.3   Total Protein 6.5 - 8.1 g/dL 8.3   Total Bilirubin 0.3 - 1.2 mg/dL 0.5   Alkaline Phos 38 - 126 U/L 44   AST 15 - 41 U/L 19   ALT 0 - 44 U/L 20    Edinburgh Score:    03/15/2022    9:39 AM  Edinburgh Postnatal Depression Scale Screening Tool  I have been able to laugh and see the funny side of things. 0  I have looked forward with enjoyment to things. 0  I have blamed myself unnecessarily when things went wrong. 2  I have been anxious or worried for no good reason. 2  I have felt scared or panicky for no good reason. 0  Things have been getting on top of me. 1  I have been so unhappy that I have had difficulty sleeping. 0  I have felt sad or miserable. 0  I have been so unhappy that I have been crying. 0  The thought of harming myself has occurred to me. 0  Edinburgh Postnatal Depression Scale Total 5      After visit meds:  Allergies as of 03/16/2022   No Known Allergies      Medication List     TAKE these medications    ibuprofen 600 MG tablet Commonly known as: ADVIL Take 1 tablet (600 mg total) by mouth every 6 (six) hours.   oxyCODONE 5 MG immediate release tablet Commonly known as: Oxy IR/ROXICODONE Take 1-2 tablets (5-10 mg total) by mouth every 6 (six) hours as needed for moderate pain.         Discharge home in stable  condition Infant Feeding: Breast Infant Disposition:home with mother Discharge instruction: per After Visit Summary and Postpartum booklet. Activity: Advance as tolerated. Pelvic rest for 6 weeks.  Diet: routine diet Anticipated Birth Control: Unsure Postpartum Appointment:6 weeks Additional Postpartum F/U: Incision check 2 weeks Future Appointments:No future appointments. Follow up Visit:  Follow-up Information     Meisinger, Todd, MD Follow up in 2 week(s).   Specialty: Obstetrics and Gynecology Why: incision check Contact information: [address] [place] [address] [place] [address] [phone]                     03/16/2022 [facility] GEFFEL Carlis Abbott, MD

## 2022-03-22 ENCOUNTER — Telehealth (HOSPITAL_COMMUNITY): Payer: Self-pay

## 2022-03-22 NOTE — Telephone Encounter (Signed)
Patient reports feeling good. "I took my dressing off." Patient asks if she should take the steri strips off of her incision. RN reviewed incision care and signs of infection to report to the provider. Patient declines any other questions/concerns about her health and healing.  Patient reports that baby is doing well. Eating, peeing/pooping, and gaining weight well. Baby sleeps in a crib. RN reviewed ABC's of safe sleep with patient. Patient declines any questions or concerns about baby.  EPDS score is 0.  Sharyn Lull [facility] 03/22/22,1229
